# Patient Record
Sex: Female | Born: 1960 | Race: White | Hispanic: No | Marital: Single | Smoking: Former smoker
Health system: Southern US, Community
[De-identification: ages and names within clinical notes are randomized; demographics above are authoritative.]

---

## 1998-03-22 ENCOUNTER — Encounter (HOSPITAL_COMMUNITY): Admission: RE | Admit: 1998-03-22 | Discharge: 1998-06-20 | Payer: Self-pay

## 1998-03-27 ENCOUNTER — Ambulatory Visit (HOSPITAL_COMMUNITY): Admission: RE | Admit: 1998-03-27 | Discharge: 1998-03-27 | Payer: Self-pay

## 2001-06-25 ENCOUNTER — Ambulatory Visit (HOSPITAL_COMMUNITY): Admission: RE | Admit: 2001-06-25 | Discharge: 2001-06-25 | Payer: Self-pay | Admitting: Gastroenterology

## 2001-07-30 ENCOUNTER — Encounter: Payer: Self-pay | Admitting: Obstetrics and Gynecology

## 2001-07-30 ENCOUNTER — Encounter: Admission: RE | Admit: 2001-07-30 | Discharge: 2001-07-30 | Payer: Self-pay | Admitting: Obstetrics and Gynecology

## 2016-04-21 DIAGNOSIS — R42 Dizziness and giddiness: Secondary | ICD-10-CM | POA: Diagnosis not present

## 2016-05-27 DIAGNOSIS — H9193 Unspecified hearing loss, bilateral: Secondary | ICD-10-CM | POA: Diagnosis not present

## 2016-05-27 DIAGNOSIS — R42 Dizziness and giddiness: Secondary | ICD-10-CM | POA: Diagnosis not present

## 2016-05-29 DIAGNOSIS — R2689 Other abnormalities of gait and mobility: Secondary | ICD-10-CM | POA: Diagnosis not present

## 2016-05-29 DIAGNOSIS — H8113 Benign paroxysmal vertigo, bilateral: Secondary | ICD-10-CM | POA: Diagnosis not present

## 2016-07-18 DIAGNOSIS — L03221 Cellulitis of neck: Secondary | ICD-10-CM | POA: Diagnosis not present

## 2016-08-11 DIAGNOSIS — Z0000 Encounter for general adult medical examination without abnormal findings: Secondary | ICD-10-CM | POA: Diagnosis not present

## 2016-08-11 DIAGNOSIS — N63 Unspecified lump in breast: Secondary | ICD-10-CM | POA: Diagnosis not present

## 2016-08-12 DIAGNOSIS — Z0000 Encounter for general adult medical examination without abnormal findings: Secondary | ICD-10-CM | POA: Diagnosis not present

## 2016-08-26 DIAGNOSIS — R8290 Unspecified abnormal findings in urine: Secondary | ICD-10-CM | POA: Diagnosis not present

## 2016-09-04 DIAGNOSIS — Z803 Family history of malignant neoplasm of breast: Secondary | ICD-10-CM | POA: Diagnosis not present

## 2016-09-04 DIAGNOSIS — N63 Unspecified lump in breast: Secondary | ICD-10-CM | POA: Diagnosis not present

## 2016-10-16 DIAGNOSIS — G4733 Obstructive sleep apnea (adult) (pediatric): Secondary | ICD-10-CM | POA: Diagnosis not present

## 2016-11-16 DIAGNOSIS — G4733 Obstructive sleep apnea (adult) (pediatric): Secondary | ICD-10-CM | POA: Diagnosis not present

## 2016-12-16 DIAGNOSIS — G4733 Obstructive sleep apnea (adult) (pediatric): Secondary | ICD-10-CM | POA: Diagnosis not present

## 2016-12-17 DIAGNOSIS — G4733 Obstructive sleep apnea (adult) (pediatric): Secondary | ICD-10-CM | POA: Diagnosis not present

## 2017-01-16 DIAGNOSIS — G4733 Obstructive sleep apnea (adult) (pediatric): Secondary | ICD-10-CM | POA: Diagnosis not present

## 2017-02-16 DIAGNOSIS — G4733 Obstructive sleep apnea (adult) (pediatric): Secondary | ICD-10-CM | POA: Diagnosis not present

## 2017-02-23 DIAGNOSIS — E559 Vitamin D deficiency, unspecified: Secondary | ICD-10-CM | POA: Diagnosis not present

## 2017-02-23 DIAGNOSIS — Z79899 Other long term (current) drug therapy: Secondary | ICD-10-CM | POA: Diagnosis not present

## 2017-02-23 DIAGNOSIS — E785 Hyperlipidemia, unspecified: Secondary | ICD-10-CM | POA: Diagnosis not present

## 2017-02-26 DIAGNOSIS — G4733 Obstructive sleep apnea (adult) (pediatric): Secondary | ICD-10-CM | POA: Diagnosis not present

## 2017-03-16 DIAGNOSIS — G4733 Obstructive sleep apnea (adult) (pediatric): Secondary | ICD-10-CM | POA: Diagnosis not present

## 2017-03-19 DIAGNOSIS — N6322 Unspecified lump in the left breast, upper inner quadrant: Secondary | ICD-10-CM | POA: Diagnosis not present

## 2017-04-16 DIAGNOSIS — G4733 Obstructive sleep apnea (adult) (pediatric): Secondary | ICD-10-CM | POA: Diagnosis not present

## 2017-06-02 DIAGNOSIS — G4733 Obstructive sleep apnea (adult) (pediatric): Secondary | ICD-10-CM | POA: Diagnosis not present

## 2017-09-03 DIAGNOSIS — G4733 Obstructive sleep apnea (adult) (pediatric): Secondary | ICD-10-CM | POA: Diagnosis not present

## 2017-09-14 DIAGNOSIS — R109 Unspecified abdominal pain: Secondary | ICD-10-CM | POA: Diagnosis not present

## 2017-09-14 DIAGNOSIS — M545 Low back pain: Secondary | ICD-10-CM | POA: Diagnosis not present

## 2017-09-14 DIAGNOSIS — R110 Nausea: Secondary | ICD-10-CM | POA: Diagnosis not present

## 2017-10-01 DIAGNOSIS — F639 Impulse disorder, unspecified: Secondary | ICD-10-CM | POA: Diagnosis not present

## 2017-10-01 DIAGNOSIS — M549 Dorsalgia, unspecified: Secondary | ICD-10-CM | POA: Diagnosis not present

## 2017-10-01 DIAGNOSIS — M545 Low back pain: Secondary | ICD-10-CM | POA: Diagnosis not present

## 2017-10-01 DIAGNOSIS — E785 Hyperlipidemia, unspecified: Secondary | ICD-10-CM | POA: Diagnosis not present

## 2017-10-15 DIAGNOSIS — Z803 Family history of malignant neoplasm of breast: Secondary | ICD-10-CM | POA: Diagnosis not present

## 2017-10-15 DIAGNOSIS — Z1231 Encounter for screening mammogram for malignant neoplasm of breast: Secondary | ICD-10-CM | POA: Diagnosis not present

## 2017-10-23 DIAGNOSIS — M545 Low back pain: Secondary | ICD-10-CM | POA: Diagnosis not present

## 2017-12-10 DIAGNOSIS — G4733 Obstructive sleep apnea (adult) (pediatric): Secondary | ICD-10-CM | POA: Diagnosis not present

## 2018-03-16 DIAGNOSIS — G4733 Obstructive sleep apnea (adult) (pediatric): Secondary | ICD-10-CM | POA: Diagnosis not present

## 2018-06-14 DIAGNOSIS — G4733 Obstructive sleep apnea (adult) (pediatric): Secondary | ICD-10-CM | POA: Diagnosis not present

## 2018-07-08 DIAGNOSIS — M25552 Pain in left hip: Secondary | ICD-10-CM | POA: Diagnosis not present

## 2018-07-08 DIAGNOSIS — F639 Impulse disorder, unspecified: Secondary | ICD-10-CM | POA: Diagnosis not present

## 2018-07-08 DIAGNOSIS — E785 Hyperlipidemia, unspecified: Secondary | ICD-10-CM | POA: Diagnosis not present

## 2018-07-14 DIAGNOSIS — G4733 Obstructive sleep apnea (adult) (pediatric): Secondary | ICD-10-CM | POA: Diagnosis not present

## 2018-08-13 DIAGNOSIS — G4733 Obstructive sleep apnea (adult) (pediatric): Secondary | ICD-10-CM | POA: Diagnosis not present

## 2018-09-14 DIAGNOSIS — G4733 Obstructive sleep apnea (adult) (pediatric): Secondary | ICD-10-CM | POA: Diagnosis not present

## 2018-09-15 DIAGNOSIS — G4733 Obstructive sleep apnea (adult) (pediatric): Secondary | ICD-10-CM | POA: Diagnosis not present

## 2018-10-14 DIAGNOSIS — Z23 Encounter for immunization: Secondary | ICD-10-CM | POA: Diagnosis not present

## 2018-11-02 DIAGNOSIS — J0110 Acute frontal sinusitis, unspecified: Secondary | ICD-10-CM | POA: Diagnosis not present

## 2018-11-02 DIAGNOSIS — Z6831 Body mass index (BMI) 31.0-31.9, adult: Secondary | ICD-10-CM | POA: Diagnosis not present

## 2018-11-02 DIAGNOSIS — R05 Cough: Secondary | ICD-10-CM | POA: Diagnosis not present

## 2018-11-25 DIAGNOSIS — Z803 Family history of malignant neoplasm of breast: Secondary | ICD-10-CM | POA: Diagnosis not present

## 2018-11-25 DIAGNOSIS — Z1231 Encounter for screening mammogram for malignant neoplasm of breast: Secondary | ICD-10-CM | POA: Diagnosis not present

## 2018-12-16 DIAGNOSIS — G4733 Obstructive sleep apnea (adult) (pediatric): Secondary | ICD-10-CM | POA: Diagnosis not present

## 2019-01-11 DIAGNOSIS — Z79899 Other long term (current) drug therapy: Secondary | ICD-10-CM | POA: Diagnosis not present

## 2019-01-11 DIAGNOSIS — F639 Impulse disorder, unspecified: Secondary | ICD-10-CM | POA: Diagnosis not present

## 2019-01-11 DIAGNOSIS — Z6830 Body mass index (BMI) 30.0-30.9, adult: Secondary | ICD-10-CM | POA: Diagnosis not present

## 2019-01-11 DIAGNOSIS — E785 Hyperlipidemia, unspecified: Secondary | ICD-10-CM | POA: Diagnosis not present

## 2019-04-04 DIAGNOSIS — G4733 Obstructive sleep apnea (adult) (pediatric): Secondary | ICD-10-CM | POA: Diagnosis not present

## 2019-04-25 DIAGNOSIS — Z6831 Body mass index (BMI) 31.0-31.9, adult: Secondary | ICD-10-CM | POA: Diagnosis not present

## 2019-04-25 DIAGNOSIS — R5383 Other fatigue: Secondary | ICD-10-CM | POA: Diagnosis not present

## 2019-04-25 DIAGNOSIS — R110 Nausea: Secondary | ICD-10-CM | POA: Diagnosis not present

## 2019-04-25 DIAGNOSIS — R109 Unspecified abdominal pain: Secondary | ICD-10-CM | POA: Diagnosis not present

## 2019-04-26 DIAGNOSIS — E785 Hyperlipidemia, unspecified: Secondary | ICD-10-CM | POA: Diagnosis not present

## 2019-04-26 DIAGNOSIS — R450 Nervousness: Secondary | ICD-10-CM | POA: Diagnosis not present

## 2019-04-26 DIAGNOSIS — R109 Unspecified abdominal pain: Secondary | ICD-10-CM | POA: Diagnosis not present

## 2019-04-26 DIAGNOSIS — W57XXXD Bitten or stung by nonvenomous insect and other nonvenomous arthropods, subsequent encounter: Secondary | ICD-10-CM | POA: Diagnosis not present

## 2019-04-26 DIAGNOSIS — R739 Hyperglycemia, unspecified: Secondary | ICD-10-CM | POA: Diagnosis not present

## 2019-04-26 DIAGNOSIS — R5383 Other fatigue: Secondary | ICD-10-CM | POA: Diagnosis not present

## 2019-05-10 DIAGNOSIS — R7303 Prediabetes: Secondary | ICD-10-CM | POA: Diagnosis not present

## 2019-05-10 DIAGNOSIS — R5383 Other fatigue: Secondary | ICD-10-CM | POA: Diagnosis not present

## 2019-05-10 DIAGNOSIS — Z6832 Body mass index (BMI) 32.0-32.9, adult: Secondary | ICD-10-CM | POA: Diagnosis not present

## 2019-05-10 DIAGNOSIS — R1084 Generalized abdominal pain: Secondary | ICD-10-CM | POA: Diagnosis not present

## 2019-05-18 DIAGNOSIS — K7689 Other specified diseases of liver: Secondary | ICD-10-CM | POA: Diagnosis not present

## 2019-05-18 DIAGNOSIS — R1084 Generalized abdominal pain: Secondary | ICD-10-CM | POA: Diagnosis not present

## 2019-05-27 DIAGNOSIS — R1011 Right upper quadrant pain: Secondary | ICD-10-CM | POA: Diagnosis not present

## 2019-06-01 DIAGNOSIS — K769 Liver disease, unspecified: Secondary | ICD-10-CM | POA: Diagnosis not present

## 2019-06-01 DIAGNOSIS — D1809 Hemangioma of other sites: Secondary | ICD-10-CM | POA: Diagnosis not present

## 2019-06-01 DIAGNOSIS — D1803 Hemangioma of intra-abdominal structures: Secondary | ICD-10-CM | POA: Diagnosis not present

## 2019-06-03 DIAGNOSIS — D1803 Hemangioma of intra-abdominal structures: Secondary | ICD-10-CM | POA: Diagnosis not present

## 2019-06-13 DIAGNOSIS — R1032 Left lower quadrant pain: Secondary | ICD-10-CM | POA: Diagnosis not present

## 2019-06-14 DIAGNOSIS — M479 Spondylosis, unspecified: Secondary | ICD-10-CM | POA: Diagnosis not present

## 2019-06-14 DIAGNOSIS — Z6832 Body mass index (BMI) 32.0-32.9, adult: Secondary | ICD-10-CM | POA: Diagnosis not present

## 2019-06-14 DIAGNOSIS — R932 Abnormal findings on diagnostic imaging of liver and biliary tract: Secondary | ICD-10-CM | POA: Diagnosis not present

## 2019-06-14 DIAGNOSIS — R1084 Generalized abdominal pain: Secondary | ICD-10-CM | POA: Diagnosis not present

## 2019-06-23 DIAGNOSIS — R109 Unspecified abdominal pain: Secondary | ICD-10-CM | POA: Diagnosis not present

## 2019-06-23 DIAGNOSIS — D126 Benign neoplasm of colon, unspecified: Secondary | ICD-10-CM | POA: Diagnosis not present

## 2019-06-23 DIAGNOSIS — Z90710 Acquired absence of both cervix and uterus: Secondary | ICD-10-CM | POA: Diagnosis not present

## 2019-06-23 DIAGNOSIS — K648 Other hemorrhoids: Secondary | ICD-10-CM | POA: Diagnosis not present

## 2019-06-23 DIAGNOSIS — D124 Benign neoplasm of descending colon: Secondary | ICD-10-CM | POA: Diagnosis not present

## 2019-06-23 DIAGNOSIS — R079 Chest pain, unspecified: Secondary | ICD-10-CM | POA: Diagnosis not present

## 2019-06-23 DIAGNOSIS — R1013 Epigastric pain: Secondary | ICD-10-CM | POA: Diagnosis not present

## 2019-06-23 DIAGNOSIS — K635 Polyp of colon: Secondary | ICD-10-CM | POA: Diagnosis not present

## 2019-06-23 DIAGNOSIS — K229 Disease of esophagus, unspecified: Secondary | ICD-10-CM | POA: Diagnosis not present

## 2019-06-23 DIAGNOSIS — Z79899 Other long term (current) drug therapy: Secondary | ICD-10-CM | POA: Diagnosis not present

## 2019-06-23 DIAGNOSIS — K219 Gastro-esophageal reflux disease without esophagitis: Secondary | ICD-10-CM | POA: Diagnosis not present

## 2019-06-29 DIAGNOSIS — R079 Chest pain, unspecified: Secondary | ICD-10-CM | POA: Diagnosis not present

## 2019-06-29 DIAGNOSIS — M545 Low back pain: Secondary | ICD-10-CM | POA: Diagnosis not present

## 2019-06-29 DIAGNOSIS — M47816 Spondylosis without myelopathy or radiculopathy, lumbar region: Secondary | ICD-10-CM | POA: Diagnosis not present

## 2019-06-29 DIAGNOSIS — M5136 Other intervertebral disc degeneration, lumbar region: Secondary | ICD-10-CM | POA: Diagnosis not present

## 2019-06-29 DIAGNOSIS — R1084 Generalized abdominal pain: Secondary | ICD-10-CM | POA: Diagnosis not present

## 2019-06-29 DIAGNOSIS — R0602 Shortness of breath: Secondary | ICD-10-CM | POA: Diagnosis not present

## 2019-07-06 DIAGNOSIS — M545 Low back pain: Secondary | ICD-10-CM | POA: Diagnosis not present

## 2019-07-07 DIAGNOSIS — Z6832 Body mass index (BMI) 32.0-32.9, adult: Secondary | ICD-10-CM | POA: Diagnosis not present

## 2019-07-07 DIAGNOSIS — M47816 Spondylosis without myelopathy or radiculopathy, lumbar region: Secondary | ICD-10-CM | POA: Diagnosis not present

## 2019-07-08 DIAGNOSIS — I10 Essential (primary) hypertension: Secondary | ICD-10-CM | POA: Diagnosis not present

## 2019-07-08 DIAGNOSIS — M545 Low back pain: Secondary | ICD-10-CM | POA: Diagnosis not present

## 2019-07-12 DIAGNOSIS — I10 Essential (primary) hypertension: Secondary | ICD-10-CM | POA: Diagnosis not present

## 2019-07-12 DIAGNOSIS — M545 Low back pain: Secondary | ICD-10-CM | POA: Diagnosis not present

## 2019-07-12 DIAGNOSIS — M47816 Spondylosis without myelopathy or radiculopathy, lumbar region: Secondary | ICD-10-CM | POA: Diagnosis not present

## 2019-07-12 DIAGNOSIS — M5136 Other intervertebral disc degeneration, lumbar region: Secondary | ICD-10-CM | POA: Diagnosis not present

## 2019-07-12 DIAGNOSIS — M48061 Spinal stenosis, lumbar region without neurogenic claudication: Secondary | ICD-10-CM | POA: Diagnosis not present

## 2019-07-15 DIAGNOSIS — M545 Low back pain: Secondary | ICD-10-CM | POA: Diagnosis not present

## 2019-07-18 DIAGNOSIS — M545 Low back pain: Secondary | ICD-10-CM | POA: Diagnosis not present

## 2019-07-20 DIAGNOSIS — M545 Low back pain: Secondary | ICD-10-CM | POA: Diagnosis not present

## 2019-07-25 DIAGNOSIS — M545 Low back pain: Secondary | ICD-10-CM | POA: Diagnosis not present

## 2019-07-27 DIAGNOSIS — R1013 Epigastric pain: Secondary | ICD-10-CM | POA: Diagnosis not present

## 2019-08-02 DIAGNOSIS — M545 Low back pain: Secondary | ICD-10-CM | POA: Diagnosis not present

## 2019-11-29 DIAGNOSIS — D225 Melanocytic nevi of trunk: Secondary | ICD-10-CM | POA: Diagnosis not present

## 2019-11-29 DIAGNOSIS — D1801 Hemangioma of skin and subcutaneous tissue: Secondary | ICD-10-CM | POA: Diagnosis not present

## 2019-11-29 DIAGNOSIS — L918 Other hypertrophic disorders of the skin: Secondary | ICD-10-CM | POA: Diagnosis not present

## 2019-11-29 DIAGNOSIS — L820 Inflamed seborrheic keratosis: Secondary | ICD-10-CM | POA: Diagnosis not present

## 2019-11-29 DIAGNOSIS — B351 Tinea unguium: Secondary | ICD-10-CM | POA: Diagnosis not present

## 2020-07-19 DIAGNOSIS — E785 Hyperlipidemia, unspecified: Secondary | ICD-10-CM | POA: Diagnosis not present

## 2020-07-19 DIAGNOSIS — F639 Impulse disorder, unspecified: Secondary | ICD-10-CM | POA: Diagnosis not present

## 2020-07-19 DIAGNOSIS — Z131 Encounter for screening for diabetes mellitus: Secondary | ICD-10-CM | POA: Diagnosis not present

## 2020-07-19 DIAGNOSIS — M47816 Spondylosis without myelopathy or radiculopathy, lumbar region: Secondary | ICD-10-CM | POA: Diagnosis not present

## 2020-07-23 DIAGNOSIS — E785 Hyperlipidemia, unspecified: Secondary | ICD-10-CM | POA: Diagnosis not present

## 2020-07-23 DIAGNOSIS — Z79899 Other long term (current) drug therapy: Secondary | ICD-10-CM | POA: Diagnosis not present

## 2020-07-23 DIAGNOSIS — Z131 Encounter for screening for diabetes mellitus: Secondary | ICD-10-CM | POA: Diagnosis not present

## 2020-11-19 DIAGNOSIS — Z1231 Encounter for screening mammogram for malignant neoplasm of breast: Secondary | ICD-10-CM | POA: Diagnosis not present

## 2020-12-31 DIAGNOSIS — Z20828 Contact with and (suspected) exposure to other viral communicable diseases: Secondary | ICD-10-CM | POA: Diagnosis not present

## 2021-01-11 DIAGNOSIS — R519 Headache, unspecified: Secondary | ICD-10-CM | POA: Diagnosis not present

## 2021-01-11 DIAGNOSIS — J029 Acute pharyngitis, unspecified: Secondary | ICD-10-CM | POA: Diagnosis not present

## 2021-01-11 DIAGNOSIS — R059 Cough, unspecified: Secondary | ICD-10-CM | POA: Diagnosis not present

## 2021-01-11 DIAGNOSIS — R051 Acute cough: Secondary | ICD-10-CM | POA: Diagnosis not present

## 2021-01-14 DIAGNOSIS — R0602 Shortness of breath: Secondary | ICD-10-CM | POA: Diagnosis not present

## 2021-01-14 DIAGNOSIS — J189 Pneumonia, unspecified organism: Secondary | ICD-10-CM | POA: Diagnosis not present

## 2021-01-14 DIAGNOSIS — U071 COVID-19: Secondary | ICD-10-CM | POA: Diagnosis not present

## 2021-04-12 DIAGNOSIS — Z79899 Other long term (current) drug therapy: Secondary | ICD-10-CM | POA: Diagnosis not present

## 2021-04-12 DIAGNOSIS — R7301 Impaired fasting glucose: Secondary | ICD-10-CM | POA: Diagnosis not present

## 2021-04-12 DIAGNOSIS — G4733 Obstructive sleep apnea (adult) (pediatric): Secondary | ICD-10-CM | POA: Diagnosis not present

## 2021-04-12 DIAGNOSIS — E785 Hyperlipidemia, unspecified: Secondary | ICD-10-CM | POA: Diagnosis not present

## 2021-04-12 DIAGNOSIS — K219 Gastro-esophageal reflux disease without esophagitis: Secondary | ICD-10-CM | POA: Diagnosis not present

## 2021-04-26 DIAGNOSIS — J209 Acute bronchitis, unspecified: Secondary | ICD-10-CM | POA: Diagnosis not present

## 2021-05-26 DIAGNOSIS — S93402A Sprain of unspecified ligament of left ankle, initial encounter: Secondary | ICD-10-CM | POA: Diagnosis not present

## 2021-06-20 DIAGNOSIS — R4701 Aphasia: Secondary | ICD-10-CM | POA: Diagnosis not present

## 2021-06-20 DIAGNOSIS — R4182 Altered mental status, unspecified: Secondary | ICD-10-CM | POA: Diagnosis not present

## 2021-06-20 DIAGNOSIS — I517 Cardiomegaly: Secondary | ICD-10-CM | POA: Diagnosis not present

## 2021-06-20 DIAGNOSIS — I6529 Occlusion and stenosis of unspecified carotid artery: Secondary | ICD-10-CM | POA: Diagnosis not present

## 2021-07-02 DIAGNOSIS — L989 Disorder of the skin and subcutaneous tissue, unspecified: Secondary | ICD-10-CM | POA: Diagnosis not present

## 2021-07-02 DIAGNOSIS — M25569 Pain in unspecified knee: Secondary | ICD-10-CM | POA: Diagnosis not present

## 2021-07-02 DIAGNOSIS — M791 Myalgia, unspecified site: Secondary | ICD-10-CM | POA: Diagnosis not present

## 2021-07-02 DIAGNOSIS — M2550 Pain in unspecified joint: Secondary | ICD-10-CM | POA: Diagnosis not present

## 2021-07-08 DIAGNOSIS — C4401 Basal cell carcinoma of skin of lip: Secondary | ICD-10-CM | POA: Diagnosis not present

## 2021-07-08 DIAGNOSIS — L821 Other seborrheic keratosis: Secondary | ICD-10-CM | POA: Diagnosis not present

## 2021-07-08 DIAGNOSIS — L570 Actinic keratosis: Secondary | ICD-10-CM | POA: Diagnosis not present

## 2021-07-08 DIAGNOSIS — D1801 Hemangioma of skin and subcutaneous tissue: Secondary | ICD-10-CM | POA: Diagnosis not present

## 2021-07-08 DIAGNOSIS — L578 Other skin changes due to chronic exposure to nonionizing radiation: Secondary | ICD-10-CM | POA: Diagnosis not present

## 2021-07-23 DIAGNOSIS — M25569 Pain in unspecified knee: Secondary | ICD-10-CM | POA: Diagnosis not present

## 2021-07-23 DIAGNOSIS — M25562 Pain in left knee: Secondary | ICD-10-CM | POA: Diagnosis not present

## 2021-07-23 DIAGNOSIS — M25561 Pain in right knee: Secondary | ICD-10-CM | POA: Diagnosis not present

## 2021-07-30 DIAGNOSIS — R413 Other amnesia: Secondary | ICD-10-CM | POA: Diagnosis not present

## 2021-07-30 DIAGNOSIS — G459 Transient cerebral ischemic attack, unspecified: Secondary | ICD-10-CM | POA: Diagnosis not present

## 2021-07-30 DIAGNOSIS — I6523 Occlusion and stenosis of bilateral carotid arteries: Secondary | ICD-10-CM | POA: Diagnosis not present

## 2021-08-13 DIAGNOSIS — G8929 Other chronic pain: Secondary | ICD-10-CM | POA: Diagnosis not present

## 2021-08-13 DIAGNOSIS — M25562 Pain in left knee: Secondary | ICD-10-CM | POA: Diagnosis not present

## 2021-08-13 DIAGNOSIS — M1712 Unilateral primary osteoarthritis, left knee: Secondary | ICD-10-CM | POA: Diagnosis not present

## 2021-08-14 DIAGNOSIS — E785 Hyperlipidemia, unspecified: Secondary | ICD-10-CM | POA: Diagnosis not present

## 2021-08-14 DIAGNOSIS — F639 Impulse disorder, unspecified: Secondary | ICD-10-CM | POA: Diagnosis not present

## 2021-08-14 DIAGNOSIS — G4733 Obstructive sleep apnea (adult) (pediatric): Secondary | ICD-10-CM | POA: Diagnosis not present

## 2021-08-14 DIAGNOSIS — R0602 Shortness of breath: Secondary | ICD-10-CM | POA: Diagnosis not present

## 2021-08-15 DIAGNOSIS — R0602 Shortness of breath: Secondary | ICD-10-CM | POA: Diagnosis not present

## 2021-09-09 DIAGNOSIS — C44319 Basal cell carcinoma of skin of other parts of face: Secondary | ICD-10-CM | POA: Diagnosis not present

## 2021-09-09 DIAGNOSIS — L570 Actinic keratosis: Secondary | ICD-10-CM | POA: Diagnosis not present

## 2021-09-09 DIAGNOSIS — L720 Epidermal cyst: Secondary | ICD-10-CM | POA: Diagnosis not present

## 2021-09-10 DIAGNOSIS — M1712 Unilateral primary osteoarthritis, left knee: Secondary | ICD-10-CM | POA: Diagnosis not present

## 2021-09-10 DIAGNOSIS — G8929 Other chronic pain: Secondary | ICD-10-CM | POA: Diagnosis not present

## 2021-09-10 DIAGNOSIS — M25562 Pain in left knee: Secondary | ICD-10-CM | POA: Diagnosis not present

## 2021-10-01 ENCOUNTER — Ambulatory Visit: Payer: BC Managed Care – PPO | Admitting: Pulmonary Disease

## 2021-10-01 ENCOUNTER — Other Ambulatory Visit: Payer: Self-pay

## 2021-10-01 ENCOUNTER — Encounter: Payer: Self-pay | Admitting: Pulmonary Disease

## 2021-10-01 VITALS — BP 122/62 | HR 82 | Temp 98.1°F | Ht 62.0 in | Wt 181.0 lb

## 2021-10-01 DIAGNOSIS — R0602 Shortness of breath: Secondary | ICD-10-CM

## 2021-10-01 NOTE — Patient Instructions (Signed)
Please get CT chest without contrast and PFTs for further evaluation of shortness of breath Return to clinic after these tests for reevaluation

## 2021-10-01 NOTE — Progress Notes (Signed)
Yvette Ortiz    [id number]    [birth year]  Primary Care Physician:Prochnau, Rayfield Citizen, MD  Referring Physician: Philemon Kingdom, MD [address] [place],  [place] [address]  Chief complaint: Consult for dyspnea  HPI: Ex-smoker with history of hyperlipidemia, GERD, OSA on CPAP Complains of dyspnea for the past 1 to 2 years.  Symptoms are at exertion and bending over.  Denies any cough, sputum production or fevers or chills  She has occasional swelling of the feet and prior imaging that shows cardiomegaly As per the patient she had a cardiac evaluation including echocardiogram at [facility] and was told it was normal She has history of OSA and is compliant with CPAP, primary care   Pets: 3 cats Occupation: Works in a Stage manager Exposures: No mold, hot tub, Jacuzzi.  No feather pillows or comforter Smoking history: Smoked 1 pack/week from 1980- 2007 Travel history: No significant travel history Relevant family history: Mother and father died of lung cancer.  They were smokers.  Outpatient Encounter Medications as of 10/01/2021  Medication Sig   albuterol (VENTOLIN HFA) 108 (90 Base) MCG/ACT inhaler SMARTSIG:2 Puff(s) By Mouth 4 Times Daily   ALPRAZolam (XANAX) 0.5 MG tablet Take 0.5 mg by mouth 3 (three) times daily as needed.   dicyclomine (BENTYL) 10 MG capsule Take 10 mg by mouth every 6 (six) hours as needed.   FLUoxetine (PROZAC) 40 MG capsule Take 40 mg by mouth daily.   lamoTRIgine (LAMICTAL) 150 MG tablet Take 150 mg by mouth 2 (two) times daily.   naproxen (NAPROSYN) 500 MG tablet Take 500 mg by mouth 2 (two) times daily.   omeprazole (PRILOSEC) 40 MG capsule Take 40 mg by mouth every morning.   tiZANidine (ZANAFLEX) 4 MG tablet Take 4 mg by mouth every 8 (eight) hours as needed.   atorvastatin (LIPITOR) 40 MG tablet Take 40 mg by mouth daily. (Patient not taking: Reported on 10/01/2021)   No facility-administered encounter  medications on file as of 10/01/2021.    Allergies as of 10/01/2021 - Review Complete 10/01/2021  Allergen Reaction Noted   Meloxicam Rash 10/01/2021    No past medical history on file.  History reviewed. No pertinent surgical history.  No family history on file.  Social History   Socioeconomic History   Marital status: Single    Spouse name: Not on file   Number of children: Not on file   Years of education: Not on file   Highest education level: Not on file  Occupational History   Not on file  Tobacco Use   Smoking status: Former    Packs/day: 0.50    Types: Cigarettes    Start date: 12/22/1980    Quit date: 12/22/2005    Years since quitting: 15.7   Smokeless tobacco: Never   Tobacco comments:    1 pack lasted a week  Substance and Sexual Activity   Alcohol use: Not on file   Drug use: Not on file   Sexual activity: Not on file  Other Topics Concern   Not on file  Social History Narrative   Not on file   Social Determinants of Health   Financial Resource Strain: Not on file  Food Insecurity: Not on file  Transportation Needs: Not on file  Physical Activity: Not on file  Stress: Not on file  Social Connections: Not on file  Intimate Partner Violence: Not on file    Review of  systems: Review of Systems  Constitutional: Negative for fever and chills.  HENT: Negative.   Eyes: Negative for blurred vision.  Respiratory: as per HPI  Cardiovascular: Negative for chest pain and palpitations.  Gastrointestinal: Negative for vomiting, diarrhea, blood per rectum. Genitourinary: Negative for dysuria, urgency, frequency and hematuria.  Musculoskeletal: Negative for myalgias, back pain and joint pain.  Skin: Negative for itching and rash.  Neurological: Negative for dizziness, tremors, focal weakness, seizures and loss of consciousness.  Endo/Heme/Allergies: Negative for environmental allergies.  Psychiatric/Behavioral: Negative for depression, suicidal ideas and  hallucinations.  All other systems reviewed and are negative.  Physical Exam: Blood pressure 122/62, pulse 82, temperature 98.1 F (36.7 C), temperature source Oral, height 5\' 2"  (1.575 m), weight 181 lb (82.1 kg), SpO2 95 %. Gen:      No acute distress HEENT:  EOMI, sclera anicteric Neck:     No masses; no thyromegaly Lungs:    Clear to auscultation bilaterally; normal respiratory effort CV:         Regular rate and rhythm; no murmurs Abd:      + bowel sounds; soft, non-tender; no palpable masses, no distension Ext:    No edema; adequate peripheral perfusion Skin:      Warm and dry; no rash Neuro: alert and oriented x 3 Psych: normal mood and affect  Data Reviewed: Imaging: CT abdomen pelvis 04/18/2019-visualized lung bases appear clear Chest x-ray [facility] 06/20/2021-lungs are clear, cardiomegaly I have reviewed the images personally.   PFTs:  Labs:  Assessment:  Consult for dyspnea Etiology for dyspnea is unclear. I have reviewed her imaging from [place] which showed clear lungs.  There is notation of cardiomegaly on chest x-ray but as per the patient cardiac work-up has been negative.  She has minimal smoking history and symptoms are not consistent with COPD. Obtain records from primary care CT chest without contrast and PFTs  OSA On CPAP from primary care  Plan/Recommendations: Obtain records CT chest PFTs  Bethesda MD [facility] 10/01/2021, 3:45 PM  CC: 12/01/2021, MD

## 2021-10-04 ENCOUNTER — Telehealth: Payer: Self-pay | Admitting: Pulmonary Disease

## 2021-10-04 NOTE — Telephone Encounter (Signed)
disregard

## 2021-10-07 ENCOUNTER — Telehealth: Payer: Self-pay | Admitting: Pulmonary Disease

## 2021-10-08 DIAGNOSIS — M25562 Pain in left knee: Secondary | ICD-10-CM | POA: Diagnosis not present

## 2021-10-08 NOTE — Telephone Encounter (Signed)
[employer] Mobile number VM is full called home number spoke to her roommate and she took the number to call can resc patient's ct

## 2021-10-14 DIAGNOSIS — M1712 Unilateral primary osteoarthritis, left knee: Secondary | ICD-10-CM | POA: Diagnosis not present

## 2021-10-15 ENCOUNTER — Other Ambulatory Visit: Payer: BC Managed Care – PPO

## 2021-10-22 ENCOUNTER — Ambulatory Visit (INDEPENDENT_AMBULATORY_CARE_PROVIDER_SITE_OTHER)
Admission: RE | Admit: 2021-10-22 | Discharge: 2021-10-22 | Disposition: A | Discharge status: Home / Self Care | Payer: BC Managed Care – PPO | Source: Ambulatory Visit | Attending: Pulmonary Disease | Admitting: Pulmonary Disease

## 2021-10-22 ENCOUNTER — Other Ambulatory Visit: Payer: Self-pay

## 2021-10-22 DIAGNOSIS — R0602 Shortness of breath: Secondary | ICD-10-CM

## 2021-10-22 DIAGNOSIS — R911 Solitary pulmonary nodule: Secondary | ICD-10-CM | POA: Diagnosis not present

## 2021-10-22 DIAGNOSIS — R918 Other nonspecific abnormal finding of lung field: Secondary | ICD-10-CM | POA: Diagnosis not present

## 2021-10-22 DIAGNOSIS — I700 Atherosclerosis of aorta: Secondary | ICD-10-CM | POA: Diagnosis not present

## 2021-10-31 ENCOUNTER — Telehealth: Payer: Self-pay | Admitting: Pulmonary Disease

## 2021-10-31 DIAGNOSIS — R918 Other nonspecific abnormal finding of lung field: Secondary | ICD-10-CM

## 2021-10-31 NOTE — Telephone Encounter (Signed)
Called and spoke with Patient.  Dr. Mannam's results and recommendations given. Understanding stated. CT chest  order placed.  Nothing further at this time.  

## 2021-11-25 DIAGNOSIS — Z1231 Encounter for screening mammogram for malignant neoplasm of breast: Secondary | ICD-10-CM | POA: Diagnosis not present

## 2021-12-02 ENCOUNTER — Other Ambulatory Visit: Payer: Self-pay | Admitting: Pulmonary Disease

## 2021-12-03 DIAGNOSIS — M25562 Pain in left knee: Secondary | ICD-10-CM | POA: Diagnosis not present

## 2021-12-03 DIAGNOSIS — S83242A Other tear of medial meniscus, current injury, left knee, initial encounter: Secondary | ICD-10-CM | POA: Diagnosis not present

## 2021-12-03 LAB — SARS CORONAVIRUS 2 (TAT 6-24 HRS): SARS Coronavirus 2: NEGATIVE

## 2021-12-05 ENCOUNTER — Ambulatory Visit: Payer: BC Managed Care – PPO | Admitting: Pulmonary Disease

## 2021-12-05 ENCOUNTER — Other Ambulatory Visit: Payer: Self-pay

## 2021-12-05 DIAGNOSIS — R0602 Shortness of breath: Secondary | ICD-10-CM

## 2021-12-05 LAB — PULMONARY FUNCTION TEST
DL/VA % pred: 98 %
DL/VA: 4.2 ml/min/mmHg/L
DLCO cor % pred: 76 %
DLCO cor: 14.5 ml/min/mmHg
DLCO unc % pred: 76 %
DLCO unc: 14.5 ml/min/mmHg
FEF 25-75 Post: 2.21 L/sec
FEF 25-75 Pre: 1.93 L/sec
FEF2575-%Change-Post: 14 %
FEF2575-%Pred-Post: 101 %
FEF2575-%Pred-Pre: 88 %
FEV1-%Change-Post: 11 %
FEV1-%Pred-Post: 76 %
FEV1-%Pred-Pre: 69 %
FEV1-Post: 1.82 L
FEV1-Pre: 1.63 L
FEV1FVC-%Change-Post: 1 %
FEV1FVC-%Pred-Pre: 107 %
FEV6-%Change-Post: 9 %
FEV6-%Pred-Post: 71 %
FEV6-%Pred-Pre: 65 %
FEV6-Post: 2.12 L
FEV6-Pre: 1.94 L
FEV6FVC-%Change-Post: 0 %
FEV6FVC-%Pred-Post: 103 %
FEV6FVC-%Pred-Pre: 103 %
FVC-%Change-Post: 9 %
FVC-%Pred-Post: 69 %
FVC-%Pred-Pre: 63 %
FVC-Post: 2.12 L
FVC-Pre: 1.95 L
Post FEV1/FVC ratio: 85 %
Post FEV6/FVC ratio: 100 %
Pre FEV1/FVC ratio: 84 %
Pre FEV6/FVC Ratio: 100 %
RV % pred: 78 %
RV: 1.53 L
TLC % pred: 78 %
TLC: 3.79 L

## 2021-12-05 NOTE — Progress Notes (Signed)
PFT done today. 

## 2021-12-06 ENCOUNTER — Ambulatory Visit: Payer: BC Managed Care – PPO | Admitting: Pulmonary Disease

## 2021-12-06 ENCOUNTER — Encounter: Payer: Self-pay | Admitting: Pulmonary Disease

## 2021-12-06 VITALS — BP 142/80 | HR 96 | Temp 98.4°F | Ht 62.5 in | Wt 185.0 lb

## 2021-12-06 DIAGNOSIS — J4540 Moderate persistent asthma, uncomplicated: Secondary | ICD-10-CM

## 2021-12-06 DIAGNOSIS — R918 Other nonspecific abnormal finding of lung field: Secondary | ICD-10-CM | POA: Diagnosis not present

## 2021-12-06 DIAGNOSIS — R0602 Shortness of breath: Secondary | ICD-10-CM | POA: Diagnosis not present

## 2021-12-06 LAB — CBC WITH DIFFERENTIAL/PLATELET
Basophils Absolute: 0 10*3/uL (ref 0.0–0.1)
Basophils Relative: 0.6 % (ref 0.0–3.0)
Eosinophils Absolute: 0.3 10*3/uL (ref 0.0–0.7)
Eosinophils Relative: 4.1 % (ref 0.0–5.0)
HCT: 38.4 % (ref 36.0–46.0)
Hemoglobin: 12.7 g/dL (ref 12.0–15.0)
Lymphocytes Relative: 35.3 % (ref 12.0–46.0)
Lymphs Abs: 2.8 10*3/uL (ref 0.7–4.0)
MCHC: 33.1 g/dL (ref 30.0–36.0)
MCV: 89.3 fl (ref 78.0–100.0)
Monocytes Absolute: 0.6 10*3/uL (ref 0.1–1.0)
Monocytes Relative: 7.8 % (ref 3.0–12.0)
Neutro Abs: 4.1 10*3/uL (ref 1.4–7.7)
Neutrophils Relative %: 52.2 % (ref 43.0–77.0)
Platelets: 237 10*3/uL (ref 150.0–400.0)
RBC: 4.3 Mil/uL (ref 3.87–5.11)
RDW: 13.4 % (ref 11.5–15.5)
WBC: 7.8 10*3/uL (ref 4.0–10.5)

## 2021-12-06 MED ORDER — DULERA 200-5 MCG/ACT IN AERO: 2.0000 | INHALATION_SPRAY | Freq: Two times a day (BID) | RESPIRATORY_TRACT | 5 refills | Status: AC

## 2021-12-06 NOTE — Progress Notes (Addendum)
Yvette Ortiz    [id number]    1961-04-13  Primary Care Physician:Prochnau, Rayfield Citizen, MD  Referring Physician: Philemon Kingdom, MD [address] [place],  [place] [address]  Chief complaint: Consult for dyspnea  HPI: Ex-smoker with history of hyperlipidemia, GERD, OSA on CPAP Complains of dyspnea for the past 1 to 2 years.  Symptoms are at exertion and bending over.  Denies any cough, sputum production or fevers or chills  She has occasional swelling of the feet and prior imaging that shows cardiomegaly As per the patient she had a cardiac evaluation including echocardiogram at [facility] and was told it was normal She has history of OSA and is compliant with CPAP, primary care  Pets: 3 cats Occupation: Works in a Stage manager Exposures: No mold, hot tub, Jacuzzi.  No feather pillows or comforter Smoking history: Smoked 1 pack/week from 1980- 2007 Travel history: No significant travel history Relevant family history: Mother and father died of lung cancer.  They were smokers.  Interim history: She is here for review of PFTs and CT scan States that dyspnea is still unchanged.  No new complaints.  Outpatient Encounter Medications as of 12/06/2021  Medication Sig   albuterol (VENTOLIN HFA) 108 (90 Base) MCG/ACT inhaler SMARTSIG:2 Puff(s) By Mouth 4 Times Daily   ALPRAZolam (XANAX) 0.5 MG tablet Take 0.5 mg by mouth 3 (three) times daily as needed.   diclofenac (VOLTAREN) 75 MG EC tablet Take 75 mg by mouth 2 (two) times daily.   dicyclomine (BENTYL) 10 MG capsule Take 10 mg by mouth every 6 (six) hours as needed.   FLUoxetine (PROZAC) 40 MG capsule Take 40 mg by mouth daily.   lamoTRIgine (LAMICTAL) 150 MG tablet Take 150 mg by mouth 2 (two) times daily.   omeprazole (PRILOSEC) 40 MG capsule Take 40 mg by mouth every morning.   tiZANidine (ZANAFLEX) 4 MG tablet Take 4 mg by mouth every 8 (eight) hours as needed.   atorvastatin (LIPITOR) 40 MG  tablet Take 40 mg by mouth daily. (Patient not taking: Reported on 10/01/2021)   naproxen (NAPROSYN) 500 MG tablet Take 500 mg by mouth 2 (two) times daily. (Patient not taking: Reported on 12/06/2021)   No facility-administered encounter medications on file as of 12/06/2021.   Physical Exam: Blood pressure (!) 142/80, pulse 96, temperature 98.4 F (36.9 C), temperature source Oral, height 5' 2.5" (1.588 m), weight 185 lb (83.9 kg), SpO2 95 %. Gen:      No acute distress HEENT:  EOMI, sclera anicteric Neck:     No masses; no thyromegaly Lungs:    Clear to auscultation bilaterally; normal respiratory effort CV:         Regular rate and rhythm; no murmurs Abd:      + bowel sounds; soft, non-tender; no palpable masses, no distension Ext:    No edema; adequate peripheral perfusion Skin:      Warm and dry; no rash Neuro: alert and oriented x 3 Psych: normal mood and affect   Data Reviewed: Imaging: CT abdomen pelvis [facility] 04/18/2019-visualized lung bases appear clear Chest x-ray [facility] 06/20/2021-lungs are clear, cardiomegaly CT chest 10/22/2021-no acute cardiopulmonary abnormality.  No ILD.  4 mm pulmonary nodules I have reviewed the images personally.  PFTs: 12/05/2021 FVC 2.12 [69%], FEV1 1.82 [76%], F/F 85 TLC 3.79 [78%], DLCO 14.50 [76%] Minimal restriction, diffusion defect  Labs:  Assessment:  Consult for dyspnea Asthma Etiology for dyspnea is  unclear. There is no evidence of interstitial lung disease and cardiac work-up as noted above is normal PFTs with minimal restriction and diffusion impairment which I suspect is from body habitus. She also may have reactive airway disease as her breathing improves with albuterol and prednisone which she gives on occasion for knee pain She has gained weight recently due to immobility after knee problem, which is contributing to dyspnea.  She is due to have arthroscopic knee surgery next month  Check CBC differential, IgE Trial of  Dulera inhaler Advised weight loss with diet.  She will need to get on an exercise regimen after knee surgery  Lung nodules Follow-up CT in 1 year ordered  OSA On CPAP from primary care  Plan/Recommendations: CBC, IgE Elwin Sleight Weight loss  Chilton Greathouse MD [facility] 12/06/2021, 3:31 PM  CC: Philemon Kingdom, MD

## 2021-12-06 NOTE — Patient Instructions (Signed)
We will check some labs today including CBC differential, IgE Start Symbicort 162 puffs in the morning 2 puffs at night Continue to work on weight loss Continue albuterol as needed  Follow-up in 3 months with video visit

## 2021-12-09 ENCOUNTER — Other Ambulatory Visit (HOSPITAL_COMMUNITY): Payer: Self-pay

## 2021-12-09 ENCOUNTER — Telehealth: Payer: Self-pay

## 2021-12-09 LAB — IGE: IgE (Immunoglobulin E), Serum: 7 kU/L (ref <=114)

## 2021-12-09 NOTE — Telephone Encounter (Signed)
Patient Advocate Encounter   Received notification from [facility] that prior authorization for Elwin Sleight is required by his/her insurance [employer].   PA submitted on 12/09/21  Key#: [id number]  Status is pending    [facility] will continue to follow:  Patient Advocate Fax:  [phone]

## 2021-12-17 NOTE — Telephone Encounter (Addendum)
Patient Advocate Encounter  Received notification from [employer] that the request for prior authorization for [facility] 200-71mcg aerosol has been denied because patient must have asthma or COPD, emphysema, chronic bronchitis, and post infectious cough.       I faxed updated chart notes to reflect that the pt has asthma and asked for a reconsideration of the PA denial.  This encounter will continue to be updated until final determination.    Specialty Pharmacy Patient Advocate Fax:  [phone]

## 2021-12-19 DIAGNOSIS — J4540 Moderate persistent asthma, uncomplicated: Secondary | ICD-10-CM | POA: Insufficient documentation

## 2021-12-19 NOTE — Telephone Encounter (Signed)
I added diagnosis of asthma to the clinic note and problem list

## 2021-12-24 ENCOUNTER — Other Ambulatory Visit (HOSPITAL_COMMUNITY): Payer: Self-pay

## 2021-12-24 NOTE — Telephone Encounter (Signed)
Patient Advocate Encounter  Prior Authorization for Dulera 200-70mcg has been approved.    PA# N/A  Effective dates: 12/22/21 through 12/22/22  Per Test Claim Patients co-pay is $60.   Spoke with Pharmacy to Process.  Patient Advocate Fax:  [phone]

## 2021-12-24 NOTE — Telephone Encounter (Signed)
Called and spoke with pt letting her know the info from prior auth team about the PA for her [facility] inhaler and she verbalized understanding. Nothing further needed.

## 2021-12-25 DIAGNOSIS — S83232A Complex tear of medial meniscus, current injury, left knee, initial encounter: Secondary | ICD-10-CM | POA: Diagnosis not present

## 2021-12-25 DIAGNOSIS — S83242A Other tear of medial meniscus, current injury, left knee, initial encounter: Secondary | ICD-10-CM | POA: Diagnosis not present

## 2021-12-25 DIAGNOSIS — M23362 Other meniscus derangements, other lateral meniscus, left knee: Secondary | ICD-10-CM | POA: Diagnosis not present

## 2021-12-25 DIAGNOSIS — G8918 Other acute postprocedural pain: Secondary | ICD-10-CM | POA: Diagnosis not present

## 2021-12-25 DIAGNOSIS — S83282A Other tear of lateral meniscus, current injury, left knee, initial encounter: Secondary | ICD-10-CM | POA: Diagnosis not present

## 2021-12-25 DIAGNOSIS — M94262 Chondromalacia, left knee: Secondary | ICD-10-CM | POA: Diagnosis not present

## 2021-12-27 ENCOUNTER — Other Ambulatory Visit (HOSPITAL_COMMUNITY): Payer: Self-pay

## 2022-01-01 DIAGNOSIS — M25562 Pain in left knee: Secondary | ICD-10-CM | POA: Diagnosis not present

## 2022-01-15 DIAGNOSIS — M25562 Pain in left knee: Secondary | ICD-10-CM | POA: Diagnosis not present

## 2022-01-22 DIAGNOSIS — M25562 Pain in left knee: Secondary | ICD-10-CM | POA: Diagnosis not present

## 2022-01-23 DIAGNOSIS — E785 Hyperlipidemia, unspecified: Secondary | ICD-10-CM | POA: Diagnosis not present

## 2022-01-23 DIAGNOSIS — Z79899 Other long term (current) drug therapy: Secondary | ICD-10-CM | POA: Diagnosis not present

## 2022-01-23 DIAGNOSIS — R7301 Impaired fasting glucose: Secondary | ICD-10-CM | POA: Diagnosis not present

## 2022-01-29 DIAGNOSIS — F419 Anxiety disorder, unspecified: Secondary | ICD-10-CM | POA: Diagnosis not present

## 2022-01-29 DIAGNOSIS — R7301 Impaired fasting glucose: Secondary | ICD-10-CM | POA: Diagnosis not present

## 2022-01-29 DIAGNOSIS — E785 Hyperlipidemia, unspecified: Secondary | ICD-10-CM | POA: Diagnosis not present

## 2022-01-29 DIAGNOSIS — K219 Gastro-esophageal reflux disease without esophagitis: Secondary | ICD-10-CM | POA: Diagnosis not present

## 2022-01-29 DIAGNOSIS — M25562 Pain in left knee: Secondary | ICD-10-CM | POA: Diagnosis not present

## 2022-02-03 DIAGNOSIS — L578 Other skin changes due to chronic exposure to nonionizing radiation: Secondary | ICD-10-CM | POA: Diagnosis not present

## 2022-02-03 DIAGNOSIS — L821 Other seborrheic keratosis: Secondary | ICD-10-CM | POA: Diagnosis not present

## 2022-02-03 DIAGNOSIS — C44319 Basal cell carcinoma of skin of other parts of face: Secondary | ICD-10-CM | POA: Diagnosis not present

## 2022-02-03 DIAGNOSIS — D225 Melanocytic nevi of trunk: Secondary | ICD-10-CM | POA: Diagnosis not present

## 2022-02-03 DIAGNOSIS — L219 Seborrheic dermatitis, unspecified: Secondary | ICD-10-CM | POA: Diagnosis not present

## 2022-02-05 DIAGNOSIS — M25562 Pain in left knee: Secondary | ICD-10-CM | POA: Diagnosis not present

## 2022-03-10 ENCOUNTER — Telehealth: Payer: BC Managed Care – PPO | Admitting: Pulmonary Disease

## 2022-03-14 DIAGNOSIS — Z5189 Encounter for other specified aftercare: Secondary | ICD-10-CM | POA: Diagnosis not present

## 2022-03-27 DIAGNOSIS — M25562 Pain in left knee: Secondary | ICD-10-CM | POA: Diagnosis not present

## 2022-05-15 DIAGNOSIS — R7301 Impaired fasting glucose: Secondary | ICD-10-CM | POA: Diagnosis not present

## 2022-05-15 DIAGNOSIS — Z79899 Other long term (current) drug therapy: Secondary | ICD-10-CM | POA: Diagnosis not present

## 2022-05-15 DIAGNOSIS — E785 Hyperlipidemia, unspecified: Secondary | ICD-10-CM | POA: Diagnosis not present

## 2022-05-22 DIAGNOSIS — M1710 Unilateral primary osteoarthritis, unspecified knee: Secondary | ICD-10-CM | POA: Diagnosis not present

## 2022-05-22 DIAGNOSIS — E785 Hyperlipidemia, unspecified: Secondary | ICD-10-CM | POA: Diagnosis not present

## 2022-05-22 DIAGNOSIS — F419 Anxiety disorder, unspecified: Secondary | ICD-10-CM | POA: Diagnosis not present

## 2022-05-22 DIAGNOSIS — R7301 Impaired fasting glucose: Secondary | ICD-10-CM | POA: Diagnosis not present

## 2022-05-27 DIAGNOSIS — M1712 Unilateral primary osteoarthritis, left knee: Secondary | ICD-10-CM | POA: Diagnosis not present

## 2022-06-11 DIAGNOSIS — R200 Anesthesia of skin: Secondary | ICD-10-CM | POA: Diagnosis not present

## 2022-06-11 DIAGNOSIS — R202 Paresthesia of skin: Secondary | ICD-10-CM | POA: Diagnosis not present

## 2022-06-11 DIAGNOSIS — M5136 Other intervertebral disc degeneration, lumbar region: Secondary | ICD-10-CM | POA: Diagnosis not present

## 2022-06-11 DIAGNOSIS — M5442 Lumbago with sciatica, left side: Secondary | ICD-10-CM | POA: Diagnosis not present

## 2022-06-11 DIAGNOSIS — M47816 Spondylosis without myelopathy or radiculopathy, lumbar region: Secondary | ICD-10-CM | POA: Diagnosis not present

## 2022-06-11 DIAGNOSIS — M4185 Other forms of scoliosis, thoracolumbar region: Secondary | ICD-10-CM | POA: Diagnosis not present

## 2022-06-11 DIAGNOSIS — R9431 Abnormal electrocardiogram [ECG] [EKG]: Secondary | ICD-10-CM | POA: Diagnosis not present

## 2022-06-11 DIAGNOSIS — M5432 Sciatica, left side: Secondary | ICD-10-CM | POA: Diagnosis not present

## 2022-06-11 DIAGNOSIS — M47817 Spondylosis without myelopathy or radiculopathy, lumbosacral region: Secondary | ICD-10-CM | POA: Diagnosis not present

## 2022-06-20 DIAGNOSIS — M5416 Radiculopathy, lumbar region: Secondary | ICD-10-CM | POA: Diagnosis not present

## 2022-06-20 DIAGNOSIS — M7989 Other specified soft tissue disorders: Secondary | ICD-10-CM | POA: Diagnosis not present

## 2022-06-25 DIAGNOSIS — M7989 Other specified soft tissue disorders: Secondary | ICD-10-CM | POA: Diagnosis not present

## 2022-07-09 DIAGNOSIS — E785 Hyperlipidemia, unspecified: Secondary | ICD-10-CM | POA: Diagnosis not present

## 2022-07-09 DIAGNOSIS — R0602 Shortness of breath: Secondary | ICD-10-CM | POA: Diagnosis not present

## 2022-07-09 DIAGNOSIS — K219 Gastro-esophageal reflux disease without esophagitis: Secondary | ICD-10-CM | POA: Diagnosis not present

## 2022-07-09 DIAGNOSIS — F419 Anxiety disorder, unspecified: Secondary | ICD-10-CM | POA: Diagnosis not present

## 2022-08-05 DIAGNOSIS — M25561 Pain in right knee: Secondary | ICD-10-CM | POA: Diagnosis not present

## 2022-08-06 DIAGNOSIS — R0602 Shortness of breath: Secondary | ICD-10-CM | POA: Diagnosis not present

## 2022-08-06 DIAGNOSIS — R635 Abnormal weight gain: Secondary | ICD-10-CM | POA: Diagnosis not present

## 2022-08-13 DIAGNOSIS — Z1331 Encounter for screening for depression: Secondary | ICD-10-CM | POA: Diagnosis not present

## 2022-08-13 DIAGNOSIS — Z0000 Encounter for general adult medical examination without abnormal findings: Secondary | ICD-10-CM | POA: Diagnosis not present

## 2022-08-13 DIAGNOSIS — E785 Hyperlipidemia, unspecified: Secondary | ICD-10-CM | POA: Diagnosis not present

## 2022-08-13 DIAGNOSIS — R7301 Impaired fasting glucose: Secondary | ICD-10-CM | POA: Diagnosis not present

## 2022-08-14 DIAGNOSIS — R0602 Shortness of breath: Secondary | ICD-10-CM | POA: Diagnosis not present

## 2022-08-19 DIAGNOSIS — M25561 Pain in right knee: Secondary | ICD-10-CM | POA: Diagnosis not present

## 2022-09-08 DIAGNOSIS — R0602 Shortness of breath: Secondary | ICD-10-CM | POA: Diagnosis not present

## 2022-09-10 DIAGNOSIS — G8918 Other acute postprocedural pain: Secondary | ICD-10-CM | POA: Diagnosis not present

## 2022-09-10 DIAGNOSIS — M23341 Other meniscus derangements, anterior horn of lateral meniscus, right knee: Secondary | ICD-10-CM | POA: Diagnosis not present

## 2022-09-10 DIAGNOSIS — S83231A Complex tear of medial meniscus, current injury, right knee, initial encounter: Secondary | ICD-10-CM | POA: Diagnosis not present

## 2022-09-10 DIAGNOSIS — M94261 Chondromalacia, right knee: Secondary | ICD-10-CM | POA: Diagnosis not present

## 2022-09-10 DIAGNOSIS — M23331 Other meniscus derangements, other medial meniscus, right knee: Secondary | ICD-10-CM | POA: Diagnosis not present

## 2022-09-10 DIAGNOSIS — M23361 Other meniscus derangements, other lateral meniscus, right knee: Secondary | ICD-10-CM | POA: Diagnosis not present

## 2022-09-10 DIAGNOSIS — S83271A Complex tear of lateral meniscus, current injury, right knee, initial encounter: Secondary | ICD-10-CM | POA: Diagnosis not present

## 2022-09-10 DIAGNOSIS — S83281A Other tear of lateral meniscus, current injury, right knee, initial encounter: Secondary | ICD-10-CM | POA: Diagnosis not present

## 2022-09-10 DIAGNOSIS — S83241A Other tear of medial meniscus, current injury, right knee, initial encounter: Secondary | ICD-10-CM | POA: Diagnosis not present

## 2022-09-14 IMAGING — CT CT CHEST W/O CM
2 of 4 series · 15 of 36 positions shown, 18 images · non-contrast
Comparison: None.

CLINICAL DATA: Dyspnea. Persistent shortness of breath and cough.
Occasional wheezing. Recurrent bronchitis.

EXAM:
CT CHEST WITHOUT CONTRAST
TECHNIQUE: Multidetector CT imaging of the chest was performed following the
standard protocol without IV contrast.

[Series 2: thorax · axial · 0.73mm/px · z∈[-285,-45]mm · 12 of 142 slices shown, 15 images]
[im 11/142  mediastinal]
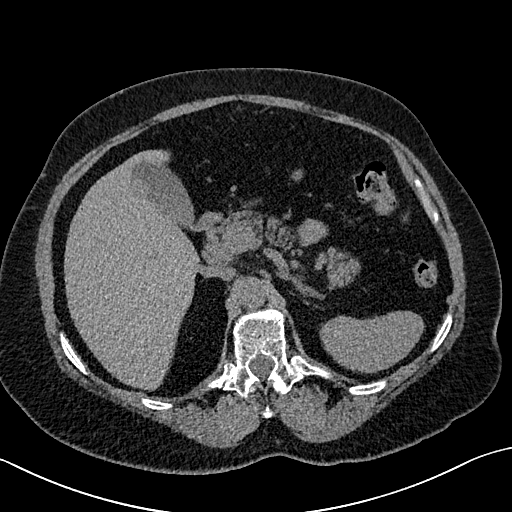
[im 11/142  lung]
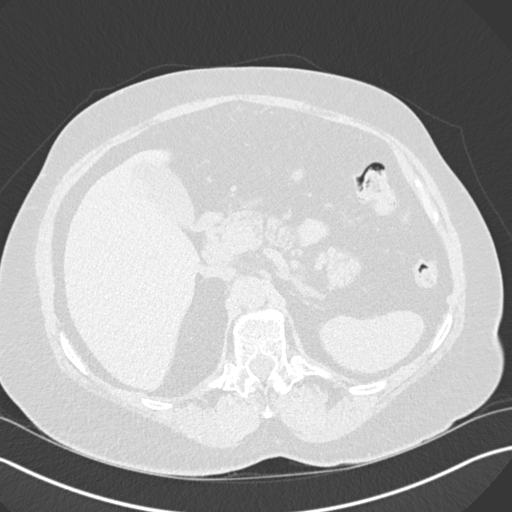
[im 22/142  lung]
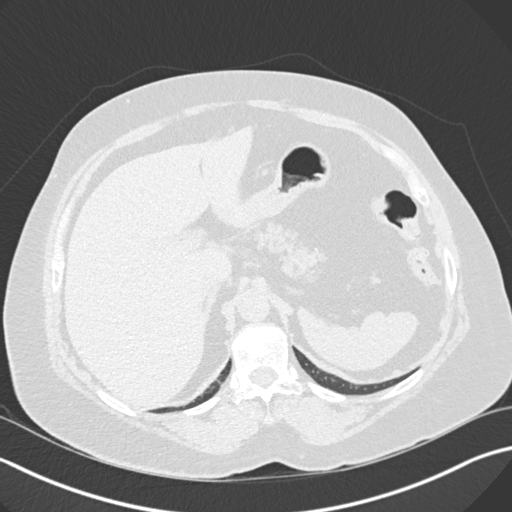
[im 33/142  lung]
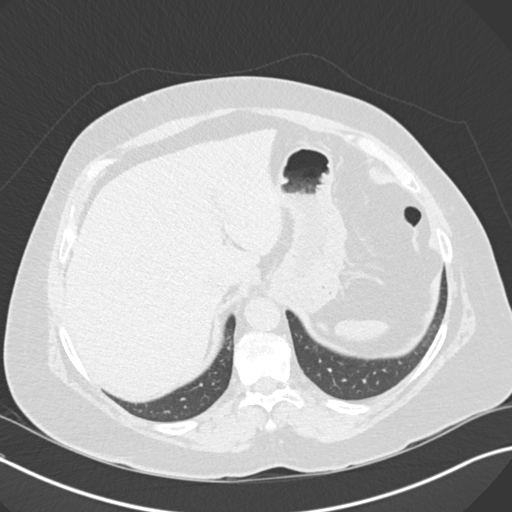
[im 44/142  lung]
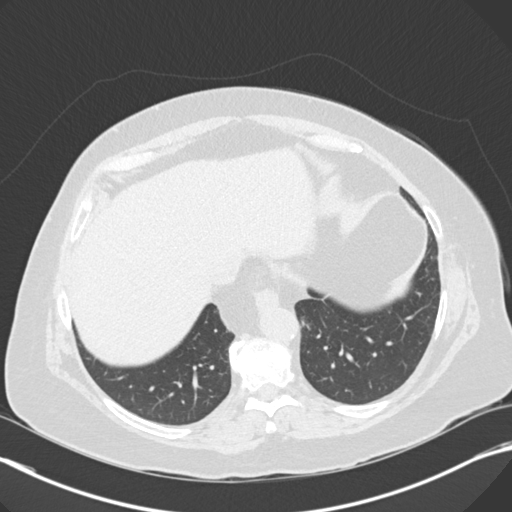
[im 55/142  mediastinal]
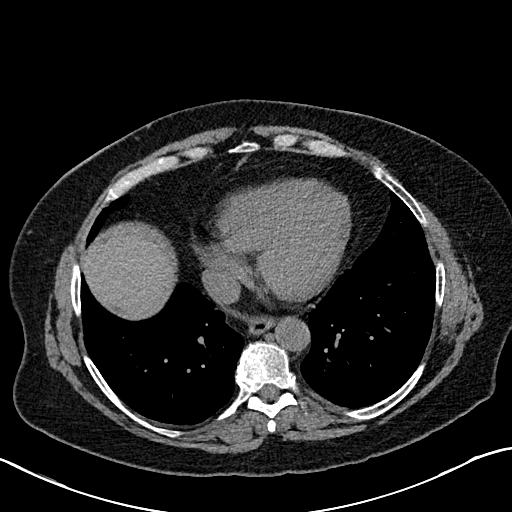
[im 55/142  lung]
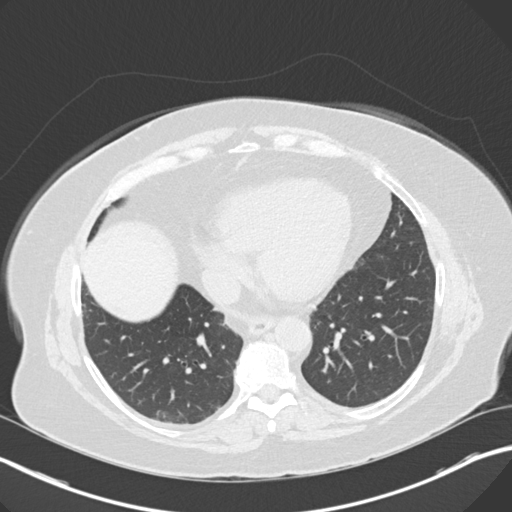
[im 66/142  lung]
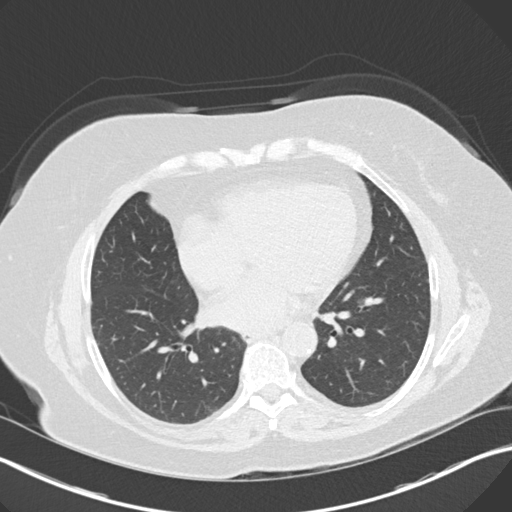
[im 76/142  lung]
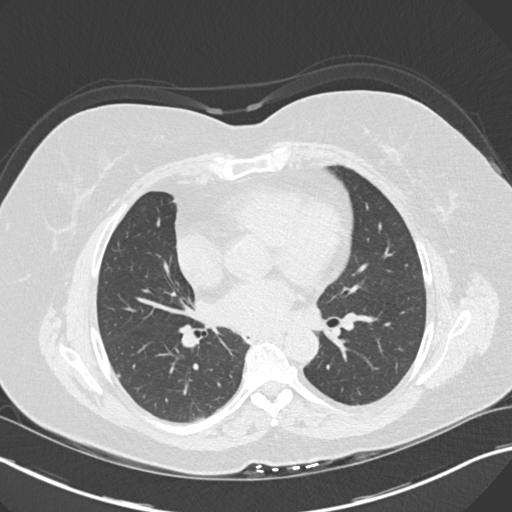
[im 87/142  lung]
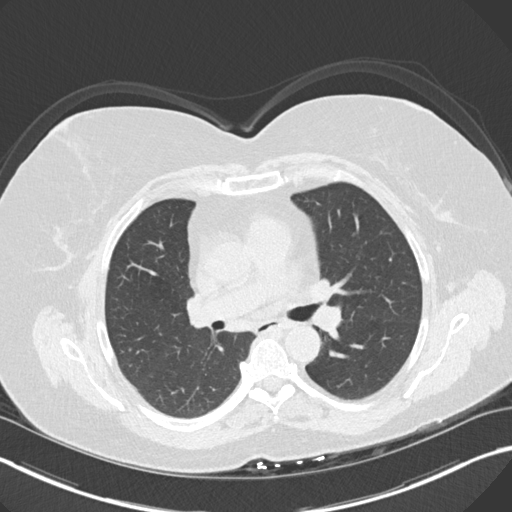
[im 98/142  mediastinal]
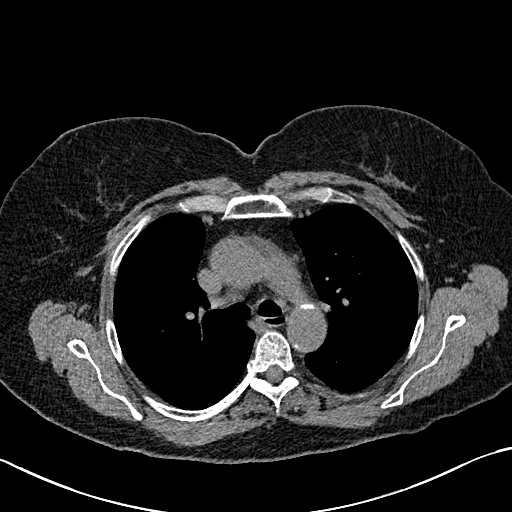
[im 98/142  lung]
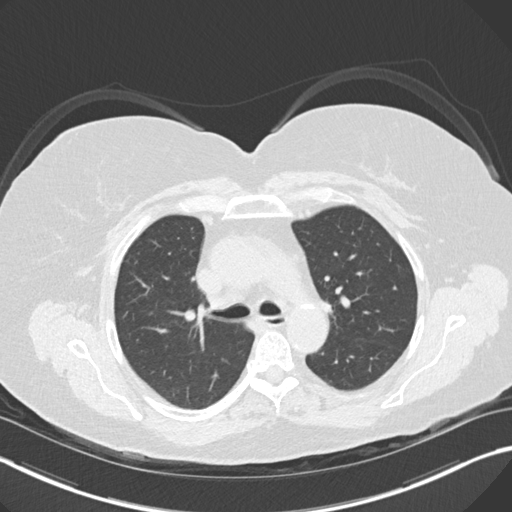
[im 109/142  lung]
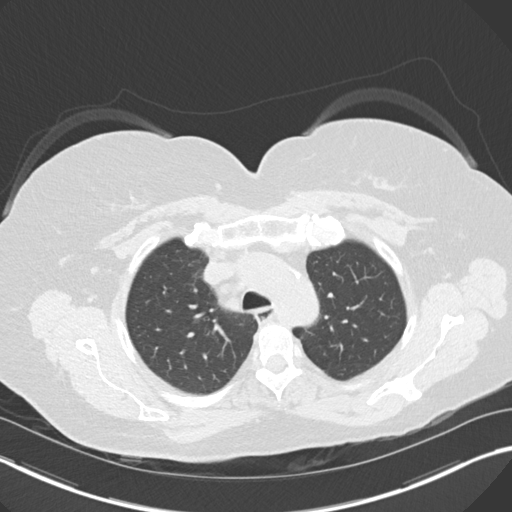
[im 120/142  lung]
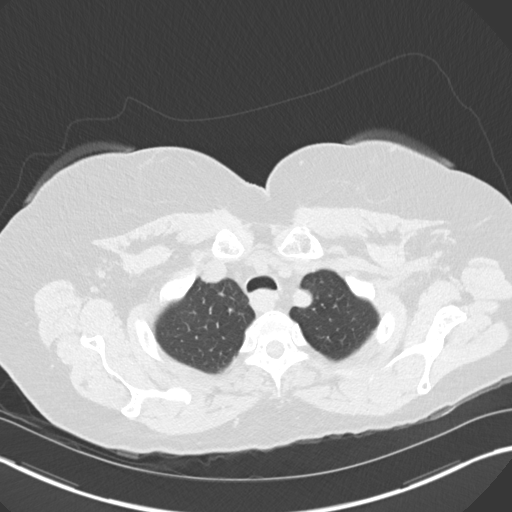
[im 131/142  lung]
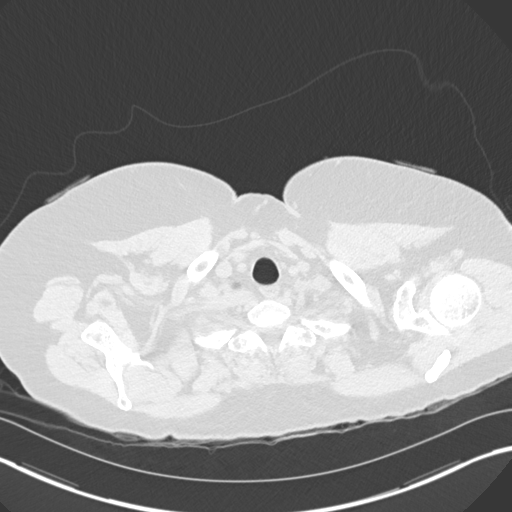

[Series 5: coronal · coronal · 0.54mm/px · 3 of 119 slices shown]
[im 24/119  lung]
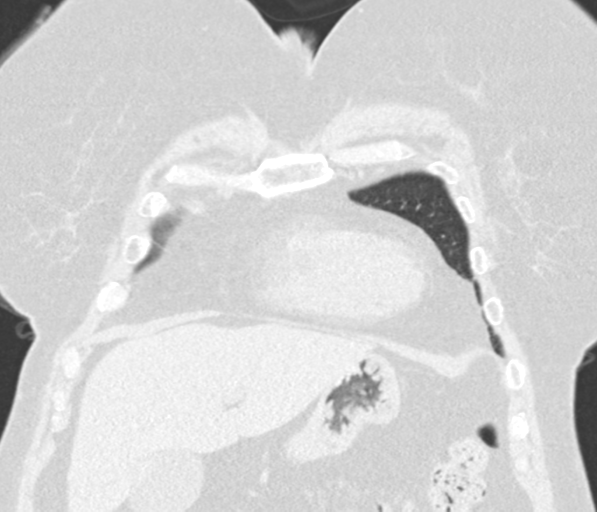
[im 48/119  lung]
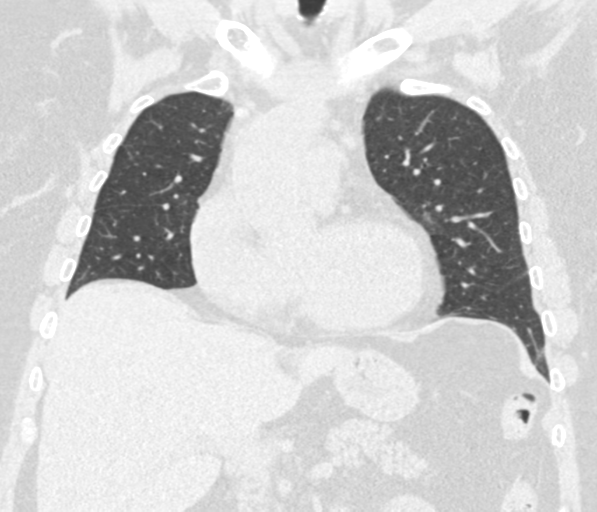
[im 71/119  lung]
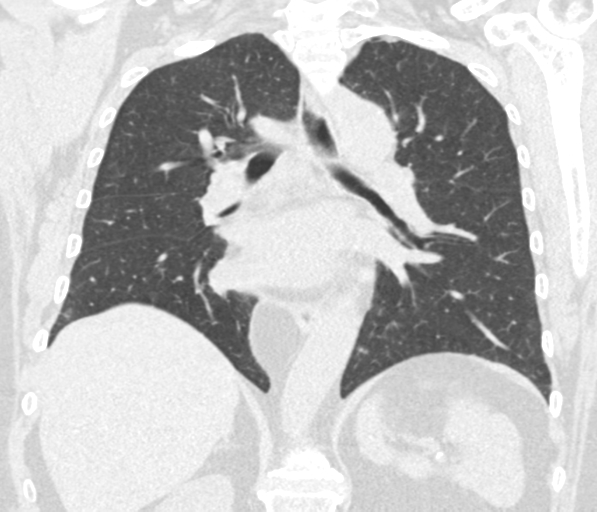

[15 of 36 positions shown; findings below may reference images not displayed]

FINDINGS: Cardiovascular: Normal heart size. Mild aortic atherosclerosis. No
pericardial effusion identified.

Mediastinum/Nodes: No enlarged mediastinal or axillary lymph nodes.
Thyroid gland, trachea, and esophagus demonstrate no significant
findings.

Lungs/Pleura: No pleural effusion, airspace consolidation, or
atelectasis. No pneumothorax. No signs of interstitial lung disease.
Tiny nodule within the subpleural right lower lobe measures 3 mm,
image 67/3. Small sub solid nodule within the subpleural aspect of
the lateral left lower lobe measures 4 mm, image 80/3. Subpleural
nodule within the medial right base measures 4 mm, image 83/3.

Upper Abdomen: No acute abnormality.

Musculoskeletal: No chest wall mass or suspicious bone lesions
identified.
IMPRESSION: 1. No acute cardiopulmonary abnormalities. No signs of interstitial
lung disease.
2. Multiple pulmonary nodules. Most severe: 4 mm left part-solid
pulmonary nodule.
No routine follow-up imaging is recommended per [HOSPITAL]
Guidelines.
These guidelines do not apply to immunocompromised patients and
patients with cancer. Follow up in patients with significant
comorbidities as clinically warranted. For lung cancer screening,
adhere to Lung-RADS guidelines. Reference: Radiology. 2158;
3. Aortic Atherosclerosis (ERF7E-0QL.L).

## 2022-09-17 DIAGNOSIS — S83231A Complex tear of medial meniscus, current injury, right knee, initial encounter: Secondary | ICD-10-CM | POA: Diagnosis not present

## 2022-09-17 DIAGNOSIS — R6889 Other general symptoms and signs: Secondary | ICD-10-CM | POA: Diagnosis not present

## 2022-09-18 ENCOUNTER — Telehealth: Payer: Self-pay | Admitting: Pulmonary Disease

## 2022-09-19 NOTE — Telephone Encounter (Signed)
Called and spoke with patient.  Dr. Matilde Bash recommendations given.  Understanding stated.  Patient scheduled with Dr. Vaughan Browner 10/24/22 at 1000 for follow up after CT.  Nothing further at time.

## 2022-09-19 NOTE — Telephone Encounter (Signed)
I do not believe the MRI will give [place] an adequate look at the lungs so I advise to keep the CT chest appointment.  She will also need a follow-up made as there is no clinic visit scheduled.

## 2022-09-19 NOTE — Telephone Encounter (Signed)
Called and spoke with patient.  Patient stated she is schedule CT chest 10/17/22.  Patient stated she was seen by cardiologist and was told they would be scheduling her an MRI.  MRI is not scheduled at this time, but patient stated she was told it would be soon.  Patient was wanting to know if CT chest needed to be changed or cancelled.  Advised patient to keep CT chest appointment and have Dr. Vaughan Browner advise on any change, if needed.   Message routed to Dr. Vaughan Browner to advise

## 2022-09-24 DIAGNOSIS — Z713 Dietary counseling and surveillance: Secondary | ICD-10-CM | POA: Diagnosis not present

## 2022-09-24 DIAGNOSIS — R7303 Prediabetes: Secondary | ICD-10-CM | POA: Diagnosis not present

## 2022-09-24 DIAGNOSIS — Z6835 Body mass index (BMI) 35.0-35.9, adult: Secondary | ICD-10-CM | POA: Diagnosis not present

## 2022-09-24 DIAGNOSIS — Z7182 Exercise counseling: Secondary | ICD-10-CM | POA: Diagnosis not present

## 2022-09-24 DIAGNOSIS — Z13228 Encounter for screening for other metabolic disorders: Secondary | ICD-10-CM | POA: Diagnosis not present

## 2022-09-26 DIAGNOSIS — R6889 Other general symptoms and signs: Secondary | ICD-10-CM | POA: Diagnosis not present

## 2022-09-26 DIAGNOSIS — S83231A Complex tear of medial meniscus, current injury, right knee, initial encounter: Secondary | ICD-10-CM | POA: Diagnosis not present

## 2022-10-01 DIAGNOSIS — E6609 Other obesity due to excess calories: Secondary | ICD-10-CM | POA: Diagnosis not present

## 2022-10-03 DIAGNOSIS — R6889 Other general symptoms and signs: Secondary | ICD-10-CM | POA: Diagnosis not present

## 2022-10-03 DIAGNOSIS — M25562 Pain in left knee: Secondary | ICD-10-CM | POA: Diagnosis not present

## 2022-10-03 DIAGNOSIS — S83231A Complex tear of medial meniscus, current injury, right knee, initial encounter: Secondary | ICD-10-CM | POA: Diagnosis not present

## 2022-10-15 DIAGNOSIS — R0602 Shortness of breath: Secondary | ICD-10-CM | POA: Diagnosis not present

## 2022-10-15 DIAGNOSIS — R0609 Other forms of dyspnea: Secondary | ICD-10-CM | POA: Diagnosis not present

## 2022-10-17 ENCOUNTER — Ambulatory Visit (HOSPITAL_COMMUNITY)
Admission: RE | Admit: 2022-10-17 | Discharge: 2022-10-17 | Disposition: A | Discharge status: Home / Self Care | Payer: BC Managed Care – PPO | Source: Ambulatory Visit | Attending: Pulmonary Disease | Admitting: Pulmonary Disease

## 2022-10-17 DIAGNOSIS — Z5189 Encounter for other specified aftercare: Secondary | ICD-10-CM | POA: Diagnosis not present

## 2022-10-17 DIAGNOSIS — R918 Other nonspecific abnormal finding of lung field: Secondary | ICD-10-CM

## 2022-10-17 DIAGNOSIS — M1711 Unilateral primary osteoarthritis, right knee: Secondary | ICD-10-CM | POA: Diagnosis not present

## 2022-10-17 DIAGNOSIS — R6889 Other general symptoms and signs: Secondary | ICD-10-CM | POA: Diagnosis not present

## 2022-10-17 DIAGNOSIS — S83231D Complex tear of medial meniscus, current injury, right knee, subsequent encounter: Secondary | ICD-10-CM | POA: Diagnosis not present

## 2022-10-20 DIAGNOSIS — R6889 Other general symptoms and signs: Secondary | ICD-10-CM | POA: Diagnosis not present

## 2022-10-20 DIAGNOSIS — S83231D Complex tear of medial meniscus, current injury, right knee, subsequent encounter: Secondary | ICD-10-CM | POA: Diagnosis not present

## 2022-10-22 DIAGNOSIS — R7303 Prediabetes: Secondary | ICD-10-CM | POA: Diagnosis not present

## 2022-10-22 DIAGNOSIS — E6609 Other obesity due to excess calories: Secondary | ICD-10-CM | POA: Diagnosis not present

## 2022-10-22 DIAGNOSIS — Z6832 Body mass index (BMI) 32.0-32.9, adult: Secondary | ICD-10-CM | POA: Diagnosis not present

## 2022-10-24 ENCOUNTER — Encounter: Payer: Self-pay | Admitting: Pulmonary Disease

## 2022-10-24 ENCOUNTER — Ambulatory Visit: Payer: BC Managed Care – PPO | Admitting: Pulmonary Disease

## 2022-10-24 VITALS — BP 112/74 | HR 92 | Temp 98.1°F | Ht 62.0 in | Wt 180.4 lb

## 2022-10-24 DIAGNOSIS — R0602 Shortness of breath: Secondary | ICD-10-CM

## 2022-10-24 DIAGNOSIS — R918 Other nonspecific abnormal finding of lung field: Secondary | ICD-10-CM | POA: Diagnosis not present

## 2022-10-24 NOTE — Progress Notes (Signed)
Yvette Ortiz    [id number]    1961/02/13  Primary Care Physician:Prochnau, Rayfield Citizen, MD  Referring Physician: Philemon Kingdom, MD [address] [place],  [place] [address]  Chief complaint: Consult for dyspnea  HPI: Ex-smoker with history of hyperlipidemia, GERD, OSA on CPAP Complains of dyspnea for the past 1 to 2 years.  Symptoms are at exertion and bending over.  Denies any cough, sputum production or fevers or chills  She has occasional swelling of the feet and prior imaging that shows cardiomegaly As per the patient she had a cardiac evaluation including echocardiogram at [facility] and was told it was normal She has history of OSA and is compliant with CPAP, primary care  Pets: 3 cats Occupation: Works in a Stage manager Exposures: No mold, hot tub, Jacuzzi.  No feather pillows or comforter Smoking history: Smoked 1 pack/week from 1980- 2007 Travel history: No significant travel history Relevant family history: Mother and father died of lung cancer.  They were smokers.  Interim history: Breathing is better.  She was given Elwin Sleight at last visit but is hardly using it Here for review of CT.  Outpatient Encounter Medications as of 10/24/2022  Medication Sig   albuterol (VENTOLIN HFA) 108 (90 Base) MCG/ACT inhaler SMARTSIG:2 Puff(s) By Mouth 4 Times Daily   ALPRAZolam (XANAX) 0.5 MG tablet Take 0.5 mg by mouth 3 (three) times daily as needed.   atorvastatin (LIPITOR) 40 MG tablet Take 40 mg by mouth daily.   diclofenac (VOLTAREN) 75 MG EC tablet Take 75 mg by mouth 2 (two) times daily.   dicyclomine (BENTYL) 10 MG capsule Take 10 mg by mouth every 6 (six) hours as needed.   FLUoxetine (PROZAC) 40 MG capsule Take 40 mg by mouth daily.   furosemide (LASIX) 20 MG tablet Take 20 mg by mouth daily.   lamoTRIgine (LAMICTAL) 150 MG tablet Take 150 mg by mouth 2 (two) times daily.   metFORMIN (GLUCOPHAGE-XR) 500 MG 24 hr tablet Take 500 mg by mouth  daily with breakfast.   mometasone-formoterol (DULERA) 200-5 MCG/ACT AERO Inhale 2 puffs into the lungs 2 (two) times daily.   naproxen (NAPROSYN) 500 MG tablet Take 500 mg by mouth 2 (two) times daily.   omeprazole (PRILOSEC) 40 MG capsule Take 40 mg by mouth every morning.   phentermine (ADIPEX-P) 37.5 MG tablet Take by mouth.   tiZANidine (ZANAFLEX) 4 MG tablet Take 4 mg by mouth every 8 (eight) hours as needed. (Patient not taking: Reported on 10/24/2022)   No facility-administered encounter medications on file as of 10/24/2022.   Physical Exam: Blood pressure 112/74, pulse 92, temperature 98.1 F (36.7 C), temperature source Oral, height 5\' 2"  (1.575 m), weight 180 lb 6.4 oz (81.8 kg), SpO2 96 %. Gen:      No acute distress HEENT:  EOMI, sclera anicteric Neck:     No masses; no thyromegaly Lungs:    Clear to auscultation bilaterally; normal respiratory effort CV:         Regular rate and rhythm; no murmurs Abd:      + bowel sounds; soft, non-tender; no palpable masses, no distension Ext:    No edema; adequate peripheral perfusion Skin:      Warm and dry; no rash Neuro: alert and oriented x 3 Psych: normal mood and affect   Data Reviewed: Imaging: CT abdomen pelvis 04/18/2019-visualized lung bases appear clear Chest x-ray [facility] 06/20/2021-lungs are clear, cardiomegaly CT  chest 10/22/2021-no acute cardiopulmonary abnormality.  No ILD.  4 mm pulmonary nodules CT chest 10/17/2022-resolution of pulmonary nodularity. I have reviewed the images personally.  PFTs: 12/05/2021 FVC 2.12 [69%], FEV1 1.82 [76%], F/F 85 TLC 3.79 [78%], DLCO 14.50 [76%] Minimal restriction, diffusion defect  Labs:  Assessment:  Consult for dyspnea Etiology for dyspnea is unclear. There is no evidence of interstitial lung disease and cardiac work-up as noted above is normal PFTs with minimal restriction and diffusion impairment which I suspect is from body habitus. We considered asthma but  there is no clear evidence of it as PFTs did not show obstruction and there is no eosinophilia or elevated IgE Inhalers have not helped and she will stop  Advised exercise regimen and weight loss.  Lung nodules Follow-up CT shows resolution of lung nodules  OSA On CPAP from primary care  Plan/Recommendations: Weight loss Follow-up as needed  Marshell Garfinkel MD [facility] 10/24/2022, 10:15 AM  CC: Ernestene Kiel, MD

## 2022-10-24 NOTE — Patient Instructions (Addendum)
I am glad you are stable with your breathing Your CT scan shows resolution of lung nodules which is good news You can follow-up in clinic as needed.  Suggest follow-up asthma

## 2022-10-27 DIAGNOSIS — R635 Abnormal weight gain: Secondary | ICD-10-CM | POA: Diagnosis not present

## 2022-10-27 DIAGNOSIS — R0602 Shortness of breath: Secondary | ICD-10-CM | POA: Diagnosis not present

## 2022-10-30 DIAGNOSIS — R7303 Prediabetes: Secondary | ICD-10-CM | POA: Diagnosis not present

## 2022-10-30 DIAGNOSIS — S83231D Complex tear of medial meniscus, current injury, right knee, subsequent encounter: Secondary | ICD-10-CM | POA: Diagnosis not present

## 2022-10-30 DIAGNOSIS — Z713 Dietary counseling and surveillance: Secondary | ICD-10-CM | POA: Diagnosis not present

## 2022-10-30 DIAGNOSIS — E6609 Other obesity due to excess calories: Secondary | ICD-10-CM | POA: Diagnosis not present

## 2022-10-30 DIAGNOSIS — R6889 Other general symptoms and signs: Secondary | ICD-10-CM | POA: Diagnosis not present

## 2022-11-06 DIAGNOSIS — R6889 Other general symptoms and signs: Secondary | ICD-10-CM | POA: Diagnosis not present

## 2022-11-06 DIAGNOSIS — S83231D Complex tear of medial meniscus, current injury, right knee, subsequent encounter: Secondary | ICD-10-CM | POA: Diagnosis not present

## 2022-11-10 DIAGNOSIS — Z6832 Body mass index (BMI) 32.0-32.9, adult: Secondary | ICD-10-CM | POA: Diagnosis not present

## 2022-11-10 DIAGNOSIS — R5383 Other fatigue: Secondary | ICD-10-CM | POA: Diagnosis not present

## 2022-11-10 DIAGNOSIS — R7303 Prediabetes: Secondary | ICD-10-CM | POA: Diagnosis not present

## 2022-11-10 DIAGNOSIS — E6609 Other obesity due to excess calories: Secondary | ICD-10-CM | POA: Diagnosis not present

## 2022-12-05 DIAGNOSIS — K529 Noninfective gastroenteritis and colitis, unspecified: Secondary | ICD-10-CM | POA: Diagnosis not present

## 2022-12-05 DIAGNOSIS — R5382 Chronic fatigue, unspecified: Secondary | ICD-10-CM | POA: Diagnosis not present

## 2022-12-05 DIAGNOSIS — R110 Nausea: Secondary | ICD-10-CM | POA: Diagnosis not present

## 2022-12-05 DIAGNOSIS — K219 Gastro-esophageal reflux disease without esophagitis: Secondary | ICD-10-CM | POA: Diagnosis not present

## 2022-12-09 DIAGNOSIS — E6609 Other obesity due to excess calories: Secondary | ICD-10-CM | POA: Diagnosis not present

## 2022-12-09 DIAGNOSIS — Z6831 Body mass index (BMI) 31.0-31.9, adult: Secondary | ICD-10-CM | POA: Diagnosis not present

## 2022-12-09 DIAGNOSIS — G4733 Obstructive sleep apnea (adult) (pediatric): Secondary | ICD-10-CM | POA: Diagnosis not present

## 2022-12-09 DIAGNOSIS — R7303 Prediabetes: Secondary | ICD-10-CM | POA: Diagnosis not present

## 2022-12-11 DIAGNOSIS — Z789 Other specified health status: Secondary | ICD-10-CM | POA: Diagnosis not present

## 2022-12-11 DIAGNOSIS — E6609 Other obesity due to excess calories: Secondary | ICD-10-CM | POA: Diagnosis not present

## 2022-12-11 DIAGNOSIS — R7303 Prediabetes: Secondary | ICD-10-CM | POA: Diagnosis not present

## 2022-12-16 DIAGNOSIS — Z1231 Encounter for screening mammogram for malignant neoplasm of breast: Secondary | ICD-10-CM | POA: Diagnosis not present

## 2023-01-06 DIAGNOSIS — F439 Reaction to severe stress, unspecified: Secondary | ICD-10-CM | POA: Diagnosis not present

## 2023-01-06 DIAGNOSIS — Z6830 Body mass index (BMI) 30.0-30.9, adult: Secondary | ICD-10-CM | POA: Diagnosis not present

## 2023-01-06 DIAGNOSIS — E6609 Other obesity due to excess calories: Secondary | ICD-10-CM | POA: Diagnosis not present

## 2023-01-08 DIAGNOSIS — R7303 Prediabetes: Secondary | ICD-10-CM | POA: Diagnosis not present

## 2023-01-08 DIAGNOSIS — Z789 Other specified health status: Secondary | ICD-10-CM | POA: Diagnosis not present

## 2023-01-08 DIAGNOSIS — Z713 Dietary counseling and surveillance: Secondary | ICD-10-CM | POA: Diagnosis not present

## 2023-02-06 DIAGNOSIS — Z1330 Encounter for screening examination for mental health and behavioral disorders, unspecified: Secondary | ICD-10-CM | POA: Diagnosis not present

## 2023-02-06 DIAGNOSIS — R0602 Shortness of breath: Secondary | ICD-10-CM | POA: Diagnosis not present

## 2023-02-06 DIAGNOSIS — I10 Essential (primary) hypertension: Secondary | ICD-10-CM | POA: Diagnosis not present

## 2023-02-06 DIAGNOSIS — G4733 Obstructive sleep apnea (adult) (pediatric): Secondary | ICD-10-CM | POA: Diagnosis not present

## 2023-02-06 DIAGNOSIS — M1711 Unilateral primary osteoarthritis, right knee: Secondary | ICD-10-CM | POA: Diagnosis not present

## 2023-02-18 DIAGNOSIS — R7301 Impaired fasting glucose: Secondary | ICD-10-CM | POA: Diagnosis not present

## 2023-02-18 DIAGNOSIS — E785 Hyperlipidemia, unspecified: Secondary | ICD-10-CM | POA: Diagnosis not present

## 2023-02-18 DIAGNOSIS — I10 Essential (primary) hypertension: Secondary | ICD-10-CM | POA: Diagnosis not present

## 2023-02-18 DIAGNOSIS — Z79899 Other long term (current) drug therapy: Secondary | ICD-10-CM | POA: Diagnosis not present

## 2023-02-18 DIAGNOSIS — I700 Atherosclerosis of aorta: Secondary | ICD-10-CM | POA: Diagnosis not present

## 2023-02-19 DIAGNOSIS — R7303 Prediabetes: Secondary | ICD-10-CM | POA: Diagnosis not present

## 2023-02-19 DIAGNOSIS — I10 Essential (primary) hypertension: Secondary | ICD-10-CM | POA: Diagnosis not present

## 2023-02-19 DIAGNOSIS — Z7182 Exercise counseling: Secondary | ICD-10-CM | POA: Diagnosis not present

## 2023-02-19 DIAGNOSIS — Z6829 Body mass index (BMI) 29.0-29.9, adult: Secondary | ICD-10-CM | POA: Diagnosis not present

## 2023-02-19 DIAGNOSIS — E663 Overweight: Secondary | ICD-10-CM | POA: Diagnosis not present

## 2023-02-19 DIAGNOSIS — Z713 Dietary counseling and surveillance: Secondary | ICD-10-CM | POA: Diagnosis not present

## 2023-02-20 DIAGNOSIS — M1711 Unilateral primary osteoarthritis, right knee: Secondary | ICD-10-CM | POA: Diagnosis not present

## 2023-02-23 DIAGNOSIS — R1084 Generalized abdominal pain: Secondary | ICD-10-CM | POA: Diagnosis not present

## 2023-02-23 DIAGNOSIS — K591 Functional diarrhea: Secondary | ICD-10-CM | POA: Diagnosis not present

## 2023-02-23 DIAGNOSIS — R112 Nausea with vomiting, unspecified: Secondary | ICD-10-CM | POA: Diagnosis not present

## 2023-03-05 DIAGNOSIS — E876 Hypokalemia: Secondary | ICD-10-CM | POA: Diagnosis not present

## 2023-03-16 DIAGNOSIS — N390 Urinary tract infection, site not specified: Secondary | ICD-10-CM | POA: Diagnosis not present

## 2023-03-17 DIAGNOSIS — N390 Urinary tract infection, site not specified: Secondary | ICD-10-CM | POA: Diagnosis not present

## 2023-03-19 DIAGNOSIS — N390 Urinary tract infection, site not specified: Secondary | ICD-10-CM | POA: Diagnosis not present

## 2023-03-19 DIAGNOSIS — R1084 Generalized abdominal pain: Secondary | ICD-10-CM | POA: Diagnosis not present

## 2023-03-19 DIAGNOSIS — N939 Abnormal uterine and vaginal bleeding, unspecified: Secondary | ICD-10-CM | POA: Diagnosis not present

## 2023-03-19 DIAGNOSIS — R7301 Impaired fasting glucose: Secondary | ICD-10-CM | POA: Diagnosis not present

## 2023-03-24 DIAGNOSIS — Z7182 Exercise counseling: Secondary | ICD-10-CM | POA: Diagnosis not present

## 2023-03-24 DIAGNOSIS — E663 Overweight: Secondary | ICD-10-CM | POA: Diagnosis not present

## 2023-03-24 DIAGNOSIS — I10 Essential (primary) hypertension: Secondary | ICD-10-CM | POA: Diagnosis not present

## 2023-03-24 DIAGNOSIS — Z6829 Body mass index (BMI) 29.0-29.9, adult: Secondary | ICD-10-CM | POA: Diagnosis not present

## 2023-03-24 DIAGNOSIS — Z713 Dietary counseling and surveillance: Secondary | ICD-10-CM | POA: Diagnosis not present

## 2023-03-24 DIAGNOSIS — R7303 Prediabetes: Secondary | ICD-10-CM | POA: Diagnosis not present

## 2023-04-02 DIAGNOSIS — N939 Abnormal uterine and vaginal bleeding, unspecified: Secondary | ICD-10-CM | POA: Diagnosis not present

## 2023-04-02 DIAGNOSIS — N921 Excessive and frequent menstruation with irregular cycle: Secondary | ICD-10-CM | POA: Diagnosis not present

## 2023-04-15 DIAGNOSIS — L821 Other seborrheic keratosis: Secondary | ICD-10-CM | POA: Diagnosis not present

## 2023-04-15 DIAGNOSIS — D225 Melanocytic nevi of trunk: Secondary | ICD-10-CM | POA: Diagnosis not present

## 2023-04-15 DIAGNOSIS — L570 Actinic keratosis: Secondary | ICD-10-CM | POA: Diagnosis not present

## 2023-04-15 DIAGNOSIS — L814 Other melanin hyperpigmentation: Secondary | ICD-10-CM | POA: Diagnosis not present

## 2023-04-22 DIAGNOSIS — R051 Acute cough: Secondary | ICD-10-CM | POA: Diagnosis not present

## 2023-04-22 DIAGNOSIS — R070 Pain in throat: Secondary | ICD-10-CM | POA: Diagnosis not present

## 2023-04-22 DIAGNOSIS — J069 Acute upper respiratory infection, unspecified: Secondary | ICD-10-CM | POA: Diagnosis not present

## 2023-05-08 DIAGNOSIS — R519 Headache, unspecified: Secondary | ICD-10-CM | POA: Diagnosis not present

## 2023-05-08 DIAGNOSIS — J069 Acute upper respiratory infection, unspecified: Secondary | ICD-10-CM | POA: Diagnosis not present

## 2023-05-08 DIAGNOSIS — R070 Pain in throat: Secondary | ICD-10-CM | POA: Diagnosis not present

## 2023-06-14 DIAGNOSIS — J309 Allergic rhinitis, unspecified: Secondary | ICD-10-CM | POA: Diagnosis not present

## 2023-06-17 DIAGNOSIS — Z79899 Other long term (current) drug therapy: Secondary | ICD-10-CM | POA: Diagnosis not present

## 2023-07-01 DIAGNOSIS — E7800 Pure hypercholesterolemia, unspecified: Secondary | ICD-10-CM | POA: Diagnosis not present

## 2023-07-01 DIAGNOSIS — R932 Abnormal findings on diagnostic imaging of liver and biliary tract: Secondary | ICD-10-CM | POA: Diagnosis not present

## 2023-07-01 DIAGNOSIS — R1032 Left lower quadrant pain: Secondary | ICD-10-CM | POA: Diagnosis not present

## 2023-07-01 DIAGNOSIS — Z79899 Other long term (current) drug therapy: Secondary | ICD-10-CM | POA: Diagnosis not present

## 2023-07-01 DIAGNOSIS — K769 Liver disease, unspecified: Secondary | ICD-10-CM | POA: Diagnosis not present

## 2023-07-01 DIAGNOSIS — K5730 Diverticulosis of large intestine without perforation or abscess without bleeding: Secondary | ICD-10-CM | POA: Diagnosis not present

## 2023-07-01 DIAGNOSIS — R109 Unspecified abdominal pain: Secondary | ICD-10-CM | POA: Diagnosis not present

## 2023-07-07 DIAGNOSIS — Z6831 Body mass index (BMI) 31.0-31.9, adult: Secondary | ICD-10-CM | POA: Diagnosis not present

## 2023-07-07 DIAGNOSIS — M25561 Pain in right knee: Secondary | ICD-10-CM | POA: Diagnosis not present

## 2023-07-07 DIAGNOSIS — Z9109 Other allergy status, other than to drugs and biological substances: Secondary | ICD-10-CM | POA: Diagnosis not present

## 2023-07-07 DIAGNOSIS — R109 Unspecified abdominal pain: Secondary | ICD-10-CM | POA: Diagnosis not present

## 2023-07-21 DIAGNOSIS — M25561 Pain in right knee: Secondary | ICD-10-CM | POA: Diagnosis not present

## 2023-07-21 DIAGNOSIS — M1711 Unilateral primary osteoarthritis, right knee: Secondary | ICD-10-CM | POA: Diagnosis not present

## 2023-08-05 DIAGNOSIS — Z6831 Body mass index (BMI) 31.0-31.9, adult: Secondary | ICD-10-CM | POA: Diagnosis not present

## 2023-08-05 DIAGNOSIS — G4733 Obstructive sleep apnea (adult) (pediatric): Secondary | ICD-10-CM | POA: Diagnosis not present

## 2023-08-05 DIAGNOSIS — Z01818 Encounter for other preprocedural examination: Secondary | ICD-10-CM | POA: Diagnosis not present

## 2023-08-05 DIAGNOSIS — Z79899 Other long term (current) drug therapy: Secondary | ICD-10-CM | POA: Diagnosis not present

## 2023-08-06 DIAGNOSIS — Z79899 Other long term (current) drug therapy: Secondary | ICD-10-CM | POA: Diagnosis not present

## 2023-08-06 DIAGNOSIS — Z01818 Encounter for other preprocedural examination: Secondary | ICD-10-CM | POA: Diagnosis not present

## 2023-08-12 DIAGNOSIS — M1711 Unilateral primary osteoarthritis, right knee: Secondary | ICD-10-CM | POA: Diagnosis not present

## 2023-08-12 DIAGNOSIS — M25561 Pain in right knee: Secondary | ICD-10-CM | POA: Diagnosis not present

## 2023-08-12 DIAGNOSIS — R6889 Other general symptoms and signs: Secondary | ICD-10-CM | POA: Diagnosis not present

## 2023-08-18 DIAGNOSIS — M1711 Unilateral primary osteoarthritis, right knee: Secondary | ICD-10-CM | POA: Diagnosis not present

## 2023-08-19 DIAGNOSIS — M25561 Pain in right knee: Secondary | ICD-10-CM | POA: Diagnosis not present

## 2023-08-19 DIAGNOSIS — G8918 Other acute postprocedural pain: Secondary | ICD-10-CM | POA: Diagnosis not present

## 2023-08-19 DIAGNOSIS — M1711 Unilateral primary osteoarthritis, right knee: Secondary | ICD-10-CM | POA: Diagnosis not present

## 2023-08-21 DIAGNOSIS — R2689 Other abnormalities of gait and mobility: Secondary | ICD-10-CM | POA: Diagnosis not present

## 2023-08-21 DIAGNOSIS — M25561 Pain in right knee: Secondary | ICD-10-CM | POA: Diagnosis not present

## 2023-08-21 DIAGNOSIS — Z96651 Presence of right artificial knee joint: Secondary | ICD-10-CM | POA: Diagnosis not present

## 2023-08-25 DIAGNOSIS — M25561 Pain in right knee: Secondary | ICD-10-CM | POA: Diagnosis not present

## 2023-08-25 DIAGNOSIS — R2689 Other abnormalities of gait and mobility: Secondary | ICD-10-CM | POA: Diagnosis not present

## 2023-08-25 DIAGNOSIS — Z96651 Presence of right artificial knee joint: Secondary | ICD-10-CM | POA: Diagnosis not present

## 2023-08-28 DIAGNOSIS — M25561 Pain in right knee: Secondary | ICD-10-CM | POA: Diagnosis not present

## 2023-08-28 DIAGNOSIS — Z96651 Presence of right artificial knee joint: Secondary | ICD-10-CM | POA: Diagnosis not present

## 2023-08-28 DIAGNOSIS — R2689 Other abnormalities of gait and mobility: Secondary | ICD-10-CM | POA: Diagnosis not present

## 2023-08-31 DIAGNOSIS — M25561 Pain in right knee: Secondary | ICD-10-CM | POA: Diagnosis not present

## 2023-08-31 DIAGNOSIS — Z96651 Presence of right artificial knee joint: Secondary | ICD-10-CM | POA: Diagnosis not present

## 2023-08-31 DIAGNOSIS — R2689 Other abnormalities of gait and mobility: Secondary | ICD-10-CM | POA: Diagnosis not present

## 2023-09-03 DIAGNOSIS — R2689 Other abnormalities of gait and mobility: Secondary | ICD-10-CM | POA: Diagnosis not present

## 2023-09-03 DIAGNOSIS — Z96651 Presence of right artificial knee joint: Secondary | ICD-10-CM | POA: Diagnosis not present

## 2023-09-03 DIAGNOSIS — M25561 Pain in right knee: Secondary | ICD-10-CM | POA: Diagnosis not present

## 2023-09-04 DIAGNOSIS — Z96651 Presence of right artificial knee joint: Secondary | ICD-10-CM | POA: Diagnosis not present

## 2023-09-09 DIAGNOSIS — R2689 Other abnormalities of gait and mobility: Secondary | ICD-10-CM | POA: Diagnosis not present

## 2023-09-09 DIAGNOSIS — Z96651 Presence of right artificial knee joint: Secondary | ICD-10-CM | POA: Diagnosis not present

## 2023-09-09 DIAGNOSIS — M25561 Pain in right knee: Secondary | ICD-10-CM | POA: Diagnosis not present

## 2023-09-11 DIAGNOSIS — Z96651 Presence of right artificial knee joint: Secondary | ICD-10-CM | POA: Diagnosis not present

## 2023-09-11 DIAGNOSIS — M25561 Pain in right knee: Secondary | ICD-10-CM | POA: Diagnosis not present

## 2023-09-11 DIAGNOSIS — G4733 Obstructive sleep apnea (adult) (pediatric): Secondary | ICD-10-CM | POA: Diagnosis not present

## 2023-09-11 DIAGNOSIS — R2689 Other abnormalities of gait and mobility: Secondary | ICD-10-CM | POA: Diagnosis not present

## 2023-09-13 DIAGNOSIS — M25561 Pain in right knee: Secondary | ICD-10-CM | POA: Diagnosis not present

## 2023-09-15 DIAGNOSIS — M25561 Pain in right knee: Secondary | ICD-10-CM | POA: Diagnosis not present

## 2023-09-15 DIAGNOSIS — Z96651 Presence of right artificial knee joint: Secondary | ICD-10-CM | POA: Diagnosis not present

## 2023-09-15 DIAGNOSIS — R2689 Other abnormalities of gait and mobility: Secondary | ICD-10-CM | POA: Diagnosis not present

## 2023-09-17 DIAGNOSIS — R2689 Other abnormalities of gait and mobility: Secondary | ICD-10-CM | POA: Diagnosis not present

## 2023-09-17 DIAGNOSIS — M25561 Pain in right knee: Secondary | ICD-10-CM | POA: Diagnosis not present

## 2023-09-17 DIAGNOSIS — Z96651 Presence of right artificial knee joint: Secondary | ICD-10-CM | POA: Diagnosis not present

## 2023-09-22 DIAGNOSIS — R2689 Other abnormalities of gait and mobility: Secondary | ICD-10-CM | POA: Diagnosis not present

## 2023-09-22 DIAGNOSIS — Z96651 Presence of right artificial knee joint: Secondary | ICD-10-CM | POA: Diagnosis not present

## 2023-09-22 DIAGNOSIS — M25561 Pain in right knee: Secondary | ICD-10-CM | POA: Diagnosis not present

## 2023-09-24 DIAGNOSIS — R2689 Other abnormalities of gait and mobility: Secondary | ICD-10-CM | POA: Diagnosis not present

## 2023-09-24 DIAGNOSIS — M25561 Pain in right knee: Secondary | ICD-10-CM | POA: Diagnosis not present

## 2023-09-24 DIAGNOSIS — Z96651 Presence of right artificial knee joint: Secondary | ICD-10-CM | POA: Diagnosis not present

## 2023-09-30 DIAGNOSIS — R2689 Other abnormalities of gait and mobility: Secondary | ICD-10-CM | POA: Diagnosis not present

## 2023-09-30 DIAGNOSIS — Z96651 Presence of right artificial knee joint: Secondary | ICD-10-CM | POA: Diagnosis not present

## 2023-09-30 DIAGNOSIS — M25561 Pain in right knee: Secondary | ICD-10-CM | POA: Diagnosis not present

## 2023-10-07 DIAGNOSIS — Z96651 Presence of right artificial knee joint: Secondary | ICD-10-CM | POA: Diagnosis not present

## 2023-10-07 DIAGNOSIS — M25561 Pain in right knee: Secondary | ICD-10-CM | POA: Diagnosis not present

## 2023-10-07 DIAGNOSIS — R2689 Other abnormalities of gait and mobility: Secondary | ICD-10-CM | POA: Diagnosis not present

## 2023-10-11 DIAGNOSIS — G4733 Obstructive sleep apnea (adult) (pediatric): Secondary | ICD-10-CM | POA: Diagnosis not present

## 2023-10-14 DIAGNOSIS — Z96651 Presence of right artificial knee joint: Secondary | ICD-10-CM | POA: Diagnosis not present

## 2023-10-14 DIAGNOSIS — F5101 Primary insomnia: Secondary | ICD-10-CM | POA: Diagnosis not present

## 2023-10-14 DIAGNOSIS — R2689 Other abnormalities of gait and mobility: Secondary | ICD-10-CM | POA: Diagnosis not present

## 2023-10-14 DIAGNOSIS — M47816 Spondylosis without myelopathy or radiculopathy, lumbar region: Secondary | ICD-10-CM | POA: Diagnosis not present

## 2023-10-14 DIAGNOSIS — G4733 Obstructive sleep apnea (adult) (pediatric): Secondary | ICD-10-CM | POA: Diagnosis not present

## 2023-10-14 DIAGNOSIS — M25561 Pain in right knee: Secondary | ICD-10-CM | POA: Diagnosis not present

## 2023-10-14 DIAGNOSIS — R12 Heartburn: Secondary | ICD-10-CM | POA: Diagnosis not present

## 2023-10-21 DIAGNOSIS — Z96651 Presence of right artificial knee joint: Secondary | ICD-10-CM | POA: Diagnosis not present

## 2023-10-21 DIAGNOSIS — M25561 Pain in right knee: Secondary | ICD-10-CM | POA: Diagnosis not present

## 2023-10-21 DIAGNOSIS — R2689 Other abnormalities of gait and mobility: Secondary | ICD-10-CM | POA: Diagnosis not present

## 2023-11-11 DIAGNOSIS — G4733 Obstructive sleep apnea (adult) (pediatric): Secondary | ICD-10-CM | POA: Diagnosis not present

## 2023-12-11 DIAGNOSIS — G4733 Obstructive sleep apnea (adult) (pediatric): Secondary | ICD-10-CM | POA: Diagnosis not present

## 2023-12-22 DIAGNOSIS — Z1231 Encounter for screening mammogram for malignant neoplasm of breast: Secondary | ICD-10-CM | POA: Diagnosis not present

## 2024-01-07 DIAGNOSIS — M67811 Other specified disorders of synovium, right shoulder: Secondary | ICD-10-CM | POA: Diagnosis not present

## 2024-01-07 DIAGNOSIS — M25511 Pain in right shoulder: Secondary | ICD-10-CM | POA: Diagnosis not present

## 2024-01-07 DIAGNOSIS — M67812 Other specified disorders of synovium, left shoulder: Secondary | ICD-10-CM | POA: Diagnosis not present

## 2024-01-07 DIAGNOSIS — M25512 Pain in left shoulder: Secondary | ICD-10-CM | POA: Diagnosis not present

## 2024-01-11 DIAGNOSIS — G4733 Obstructive sleep apnea (adult) (pediatric): Secondary | ICD-10-CM | POA: Diagnosis not present

## 2024-01-15 DIAGNOSIS — Z96651 Presence of right artificial knee joint: Secondary | ICD-10-CM | POA: Diagnosis not present

## 2024-01-19 DIAGNOSIS — R070 Pain in throat: Secondary | ICD-10-CM | POA: Diagnosis not present

## 2024-01-19 DIAGNOSIS — R0981 Nasal congestion: Secondary | ICD-10-CM | POA: Diagnosis not present

## 2024-01-19 DIAGNOSIS — R051 Acute cough: Secondary | ICD-10-CM | POA: Diagnosis not present

## 2024-01-23 DIAGNOSIS — R051 Acute cough: Secondary | ICD-10-CM | POA: Diagnosis not present

## 2024-01-23 DIAGNOSIS — R0602 Shortness of breath: Secondary | ICD-10-CM | POA: Diagnosis not present

## 2024-01-23 DIAGNOSIS — R0981 Nasal congestion: Secondary | ICD-10-CM | POA: Diagnosis not present

## 2024-01-31 DIAGNOSIS — R0981 Nasal congestion: Secondary | ICD-10-CM | POA: Diagnosis not present

## 2024-01-31 DIAGNOSIS — R42 Dizziness and giddiness: Secondary | ICD-10-CM | POA: Diagnosis not present

## 2024-02-02 DIAGNOSIS — Z6834 Body mass index (BMI) 34.0-34.9, adult: Secondary | ICD-10-CM | POA: Diagnosis not present

## 2024-02-02 DIAGNOSIS — R062 Wheezing: Secondary | ICD-10-CM | POA: Diagnosis not present

## 2024-02-02 DIAGNOSIS — K219 Gastro-esophageal reflux disease without esophagitis: Secondary | ICD-10-CM | POA: Diagnosis not present

## 2024-02-02 DIAGNOSIS — J40 Bronchitis, not specified as acute or chronic: Secondary | ICD-10-CM | POA: Diagnosis not present

## 2024-02-04 DIAGNOSIS — J180 Bronchopneumonia, unspecified organism: Secondary | ICD-10-CM | POA: Diagnosis not present

## 2024-02-04 DIAGNOSIS — Z6833 Body mass index (BMI) 33.0-33.9, adult: Secondary | ICD-10-CM | POA: Diagnosis not present

## 2024-02-04 DIAGNOSIS — R059 Cough, unspecified: Secondary | ICD-10-CM | POA: Diagnosis not present

## 2024-02-08 DIAGNOSIS — Z6832 Body mass index (BMI) 32.0-32.9, adult: Secondary | ICD-10-CM | POA: Diagnosis not present

## 2024-02-08 DIAGNOSIS — J180 Bronchopneumonia, unspecified organism: Secondary | ICD-10-CM | POA: Diagnosis not present

## 2024-02-08 DIAGNOSIS — R059 Cough, unspecified: Secondary | ICD-10-CM | POA: Diagnosis not present

## 2024-02-08 DIAGNOSIS — R0602 Shortness of breath: Secondary | ICD-10-CM | POA: Diagnosis not present

## 2024-02-11 DIAGNOSIS — G4733 Obstructive sleep apnea (adult) (pediatric): Secondary | ICD-10-CM | POA: Diagnosis not present

## 2024-02-26 DIAGNOSIS — M25512 Pain in left shoulder: Secondary | ICD-10-CM | POA: Diagnosis not present

## 2024-02-26 DIAGNOSIS — Z96651 Presence of right artificial knee joint: Secondary | ICD-10-CM | POA: Diagnosis not present

## 2024-03-01 DIAGNOSIS — R5383 Other fatigue: Secondary | ICD-10-CM | POA: Diagnosis not present

## 2024-03-01 DIAGNOSIS — R062 Wheezing: Secondary | ICD-10-CM | POA: Diagnosis not present

## 2024-03-01 DIAGNOSIS — Z6833 Body mass index (BMI) 33.0-33.9, adult: Secondary | ICD-10-CM | POA: Diagnosis not present

## 2024-03-01 DIAGNOSIS — R197 Diarrhea, unspecified: Secondary | ICD-10-CM | POA: Diagnosis not present

## 2024-03-10 DIAGNOSIS — M25511 Pain in right shoulder: Secondary | ICD-10-CM | POA: Diagnosis not present

## 2024-03-10 DIAGNOSIS — M25512 Pain in left shoulder: Secondary | ICD-10-CM | POA: Diagnosis not present

## 2024-03-10 DIAGNOSIS — G4733 Obstructive sleep apnea (adult) (pediatric): Secondary | ICD-10-CM | POA: Diagnosis not present

## 2024-03-24 DIAGNOSIS — M25512 Pain in left shoulder: Secondary | ICD-10-CM | POA: Diagnosis not present

## 2024-03-24 DIAGNOSIS — M25511 Pain in right shoulder: Secondary | ICD-10-CM | POA: Diagnosis not present

## 2024-03-29 DIAGNOSIS — M7542 Impingement syndrome of left shoulder: Secondary | ICD-10-CM | POA: Diagnosis not present

## 2024-03-29 DIAGNOSIS — M7541 Impingement syndrome of right shoulder: Secondary | ICD-10-CM | POA: Diagnosis not present

## 2024-04-04 DIAGNOSIS — Z79899 Other long term (current) drug therapy: Secondary | ICD-10-CM | POA: Diagnosis not present

## 2024-04-04 DIAGNOSIS — E785 Hyperlipidemia, unspecified: Secondary | ICD-10-CM | POA: Diagnosis not present

## 2024-04-04 DIAGNOSIS — R7301 Impaired fasting glucose: Secondary | ICD-10-CM | POA: Diagnosis not present

## 2024-04-06 DIAGNOSIS — M7541 Impingement syndrome of right shoulder: Secondary | ICD-10-CM | POA: Diagnosis not present

## 2024-04-10 DIAGNOSIS — G4733 Obstructive sleep apnea (adult) (pediatric): Secondary | ICD-10-CM | POA: Diagnosis not present

## 2024-04-12 DIAGNOSIS — M12811 Other specific arthropathies, not elsewhere classified, right shoulder: Secondary | ICD-10-CM | POA: Diagnosis not present

## 2024-04-12 DIAGNOSIS — M7542 Impingement syndrome of left shoulder: Secondary | ICD-10-CM | POA: Diagnosis not present

## 2024-04-12 DIAGNOSIS — M75101 Unspecified rotator cuff tear or rupture of right shoulder, not specified as traumatic: Secondary | ICD-10-CM | POA: Diagnosis not present

## 2024-04-13 DIAGNOSIS — F419 Anxiety disorder, unspecified: Secondary | ICD-10-CM | POA: Diagnosis not present

## 2024-04-13 DIAGNOSIS — K219 Gastro-esophageal reflux disease without esophagitis: Secondary | ICD-10-CM | POA: Diagnosis not present

## 2024-04-13 DIAGNOSIS — E785 Hyperlipidemia, unspecified: Secondary | ICD-10-CM | POA: Diagnosis not present

## 2024-04-13 DIAGNOSIS — R7303 Prediabetes: Secondary | ICD-10-CM | POA: Diagnosis not present

## 2024-04-20 DIAGNOSIS — M7542 Impingement syndrome of left shoulder: Secondary | ICD-10-CM | POA: Diagnosis not present

## 2024-05-09 DIAGNOSIS — R0600 Dyspnea, unspecified: Secondary | ICD-10-CM | POA: Diagnosis not present

## 2024-05-09 DIAGNOSIS — R600 Localized edema: Secondary | ICD-10-CM | POA: Diagnosis not present

## 2024-05-09 DIAGNOSIS — Z01812 Encounter for preprocedural laboratory examination: Secondary | ICD-10-CM | POA: Diagnosis not present

## 2024-05-09 DIAGNOSIS — R7303 Prediabetes: Secondary | ICD-10-CM | POA: Diagnosis not present

## 2024-05-09 DIAGNOSIS — G4733 Obstructive sleep apnea (adult) (pediatric): Secondary | ICD-10-CM | POA: Diagnosis not present

## 2024-05-09 DIAGNOSIS — M12811 Other specific arthropathies, not elsewhere classified, right shoulder: Secondary | ICD-10-CM | POA: Diagnosis not present

## 2024-05-09 DIAGNOSIS — Z01818 Encounter for other preprocedural examination: Secondary | ICD-10-CM | POA: Diagnosis not present

## 2024-05-09 DIAGNOSIS — E785 Hyperlipidemia, unspecified: Secondary | ICD-10-CM | POA: Diagnosis not present

## 2024-05-09 DIAGNOSIS — M25511 Pain in right shoulder: Secondary | ICD-10-CM | POA: Diagnosis not present

## 2024-05-10 DIAGNOSIS — G4733 Obstructive sleep apnea (adult) (pediatric): Secondary | ICD-10-CM | POA: Diagnosis not present

## 2024-05-17 DIAGNOSIS — M75101 Unspecified rotator cuff tear or rupture of right shoulder, not specified as traumatic: Secondary | ICD-10-CM | POA: Diagnosis not present

## 2024-05-17 DIAGNOSIS — Z01818 Encounter for other preprocedural examination: Secondary | ICD-10-CM | POA: Diagnosis not present

## 2024-05-17 DIAGNOSIS — M7542 Impingement syndrome of left shoulder: Secondary | ICD-10-CM | POA: Diagnosis not present

## 2024-05-17 DIAGNOSIS — M7541 Impingement syndrome of right shoulder: Secondary | ICD-10-CM | POA: Diagnosis not present

## 2024-05-17 DIAGNOSIS — Z01812 Encounter for preprocedural laboratory examination: Secondary | ICD-10-CM | POA: Diagnosis not present

## 2024-05-23 DIAGNOSIS — K219 Gastro-esophageal reflux disease without esophagitis: Secondary | ICD-10-CM | POA: Diagnosis not present

## 2024-05-23 DIAGNOSIS — G8918 Other acute postprocedural pain: Secondary | ICD-10-CM | POA: Diagnosis not present

## 2024-05-23 DIAGNOSIS — Z85828 Personal history of other malignant neoplasm of skin: Secondary | ICD-10-CM | POA: Diagnosis not present

## 2024-05-23 DIAGNOSIS — Z79899 Other long term (current) drug therapy: Secondary | ICD-10-CM | POA: Diagnosis not present

## 2024-05-23 DIAGNOSIS — G4733 Obstructive sleep apnea (adult) (pediatric): Secondary | ICD-10-CM | POA: Diagnosis not present

## 2024-05-23 DIAGNOSIS — Z96651 Presence of right artificial knee joint: Secondary | ICD-10-CM | POA: Diagnosis not present

## 2024-05-23 DIAGNOSIS — M12811 Other specific arthropathies, not elsewhere classified, right shoulder: Secondary | ICD-10-CM | POA: Diagnosis not present

## 2024-05-23 DIAGNOSIS — F419 Anxiety disorder, unspecified: Secondary | ICD-10-CM | POA: Diagnosis not present

## 2024-05-23 DIAGNOSIS — M75121 Complete rotator cuff tear or rupture of right shoulder, not specified as traumatic: Secondary | ICD-10-CM | POA: Diagnosis not present

## 2024-05-23 DIAGNOSIS — Z471 Aftercare following joint replacement surgery: Secondary | ICD-10-CM | POA: Diagnosis not present

## 2024-05-23 DIAGNOSIS — M75101 Unspecified rotator cuff tear or rupture of right shoulder, not specified as traumatic: Secondary | ICD-10-CM | POA: Diagnosis not present

## 2024-05-23 DIAGNOSIS — Z90710 Acquired absence of both cervix and uterus: Secondary | ICD-10-CM | POA: Diagnosis not present

## 2024-05-23 DIAGNOSIS — Z6832 Body mass index (BMI) 32.0-32.9, adult: Secondary | ICD-10-CM | POA: Diagnosis not present

## 2024-05-23 DIAGNOSIS — Z888 Allergy status to other drugs, medicaments and biological substances status: Secondary | ICD-10-CM | POA: Diagnosis not present

## 2024-05-23 DIAGNOSIS — Z96611 Presence of right artificial shoulder joint: Secondary | ICD-10-CM | POA: Diagnosis not present

## 2024-05-23 DIAGNOSIS — E669 Obesity, unspecified: Secondary | ICD-10-CM | POA: Diagnosis not present

## 2024-05-23 DIAGNOSIS — Z87891 Personal history of nicotine dependence: Secondary | ICD-10-CM | POA: Diagnosis not present

## 2024-05-24 DIAGNOSIS — Z85828 Personal history of other malignant neoplasm of skin: Secondary | ICD-10-CM | POA: Diagnosis not present

## 2024-05-24 DIAGNOSIS — K219 Gastro-esophageal reflux disease without esophagitis: Secondary | ICD-10-CM | POA: Diagnosis not present

## 2024-05-24 DIAGNOSIS — G4733 Obstructive sleep apnea (adult) (pediatric): Secondary | ICD-10-CM | POA: Diagnosis not present

## 2024-05-24 DIAGNOSIS — Z888 Allergy status to other drugs, medicaments and biological substances status: Secondary | ICD-10-CM | POA: Diagnosis not present

## 2024-05-24 DIAGNOSIS — M75101 Unspecified rotator cuff tear or rupture of right shoulder, not specified as traumatic: Secondary | ICD-10-CM | POA: Diagnosis not present

## 2024-05-24 DIAGNOSIS — Z90710 Acquired absence of both cervix and uterus: Secondary | ICD-10-CM | POA: Diagnosis not present

## 2024-05-24 DIAGNOSIS — Z6832 Body mass index (BMI) 32.0-32.9, adult: Secondary | ICD-10-CM | POA: Diagnosis not present

## 2024-05-24 DIAGNOSIS — Z79899 Other long term (current) drug therapy: Secondary | ICD-10-CM | POA: Diagnosis not present

## 2024-05-24 DIAGNOSIS — F419 Anxiety disorder, unspecified: Secondary | ICD-10-CM | POA: Diagnosis not present

## 2024-05-24 DIAGNOSIS — E669 Obesity, unspecified: Secondary | ICD-10-CM | POA: Diagnosis not present

## 2024-05-24 DIAGNOSIS — Z96651 Presence of right artificial knee joint: Secondary | ICD-10-CM | POA: Diagnosis not present

## 2024-05-24 DIAGNOSIS — Z87891 Personal history of nicotine dependence: Secondary | ICD-10-CM | POA: Diagnosis not present

## 2024-05-30 DIAGNOSIS — M25511 Pain in right shoulder: Secondary | ICD-10-CM | POA: Diagnosis not present

## 2024-06-03 DIAGNOSIS — M25511 Pain in right shoulder: Secondary | ICD-10-CM | POA: Diagnosis not present

## 2024-06-08 DIAGNOSIS — M25511 Pain in right shoulder: Secondary | ICD-10-CM | POA: Diagnosis not present

## 2024-06-09 DIAGNOSIS — M25511 Pain in right shoulder: Secondary | ICD-10-CM | POA: Diagnosis not present

## 2024-06-10 DIAGNOSIS — M25511 Pain in right shoulder: Secondary | ICD-10-CM | POA: Diagnosis not present

## 2024-06-10 DIAGNOSIS — G4733 Obstructive sleep apnea (adult) (pediatric): Secondary | ICD-10-CM | POA: Diagnosis not present

## 2024-06-16 DIAGNOSIS — M25511 Pain in right shoulder: Secondary | ICD-10-CM | POA: Diagnosis not present

## 2024-06-20 DIAGNOSIS — M25511 Pain in right shoulder: Secondary | ICD-10-CM | POA: Diagnosis not present

## 2024-06-22 DIAGNOSIS — M25511 Pain in right shoulder: Secondary | ICD-10-CM | POA: Diagnosis not present

## 2024-06-28 DIAGNOSIS — M25511 Pain in right shoulder: Secondary | ICD-10-CM | POA: Diagnosis not present

## 2024-07-01 DIAGNOSIS — M25511 Pain in right shoulder: Secondary | ICD-10-CM | POA: Diagnosis not present

## 2024-07-04 DIAGNOSIS — M25511 Pain in right shoulder: Secondary | ICD-10-CM | POA: Diagnosis not present

## 2024-07-07 DIAGNOSIS — M25511 Pain in right shoulder: Secondary | ICD-10-CM | POA: Diagnosis not present

## 2024-07-11 DIAGNOSIS — M25511 Pain in right shoulder: Secondary | ICD-10-CM | POA: Diagnosis not present

## 2024-07-13 DIAGNOSIS — F639 Impulse disorder, unspecified: Secondary | ICD-10-CM | POA: Diagnosis not present

## 2024-07-13 DIAGNOSIS — K219 Gastro-esophageal reflux disease without esophagitis: Secondary | ICD-10-CM | POA: Diagnosis not present

## 2024-07-13 DIAGNOSIS — E785 Hyperlipidemia, unspecified: Secondary | ICD-10-CM | POA: Diagnosis not present

## 2024-07-14 DIAGNOSIS — M25511 Pain in right shoulder: Secondary | ICD-10-CM | POA: Diagnosis not present

## 2024-07-18 DIAGNOSIS — M25511 Pain in right shoulder: Secondary | ICD-10-CM | POA: Diagnosis not present

## 2024-07-21 DIAGNOSIS — M25511 Pain in right shoulder: Secondary | ICD-10-CM | POA: Diagnosis not present

## 2024-07-22 DIAGNOSIS — G4733 Obstructive sleep apnea (adult) (pediatric): Secondary | ICD-10-CM | POA: Diagnosis not present

## 2024-07-25 DIAGNOSIS — M25511 Pain in right shoulder: Secondary | ICD-10-CM | POA: Diagnosis not present

## 2024-07-27 DIAGNOSIS — Z79899 Other long term (current) drug therapy: Secondary | ICD-10-CM | POA: Diagnosis not present

## 2024-07-27 DIAGNOSIS — R7303 Prediabetes: Secondary | ICD-10-CM | POA: Diagnosis not present

## 2024-07-27 DIAGNOSIS — E785 Hyperlipidemia, unspecified: Secondary | ICD-10-CM | POA: Diagnosis not present

## 2024-07-28 DIAGNOSIS — M25511 Pain in right shoulder: Secondary | ICD-10-CM | POA: Diagnosis not present

## 2024-08-01 DIAGNOSIS — M25511 Pain in right shoulder: Secondary | ICD-10-CM | POA: Diagnosis not present

## 2024-08-04 DIAGNOSIS — M25511 Pain in right shoulder: Secondary | ICD-10-CM | POA: Diagnosis not present

## 2024-08-08 DIAGNOSIS — M25511 Pain in right shoulder: Secondary | ICD-10-CM | POA: Diagnosis not present

## 2024-08-11 DIAGNOSIS — M25511 Pain in right shoulder: Secondary | ICD-10-CM | POA: Diagnosis not present

## 2024-08-15 DIAGNOSIS — M25511 Pain in right shoulder: Secondary | ICD-10-CM | POA: Diagnosis not present

## 2024-08-16 DIAGNOSIS — M19012 Primary osteoarthritis, left shoulder: Secondary | ICD-10-CM | POA: Diagnosis not present

## 2024-08-16 DIAGNOSIS — M7522 Bicipital tendinitis, left shoulder: Secondary | ICD-10-CM | POA: Diagnosis not present

## 2024-08-16 DIAGNOSIS — Z5189 Encounter for other specified aftercare: Secondary | ICD-10-CM | POA: Diagnosis not present

## 2024-08-16 DIAGNOSIS — M75122 Complete rotator cuff tear or rupture of left shoulder, not specified as traumatic: Secondary | ICD-10-CM | POA: Diagnosis not present

## 2024-08-23 DIAGNOSIS — M25511 Pain in right shoulder: Secondary | ICD-10-CM | POA: Diagnosis not present

## 2024-09-01 DIAGNOSIS — M25511 Pain in right shoulder: Secondary | ICD-10-CM | POA: Diagnosis not present

## 2024-09-05 DIAGNOSIS — M25511 Pain in right shoulder: Secondary | ICD-10-CM | POA: Diagnosis not present

## 2024-09-12 DIAGNOSIS — M25511 Pain in right shoulder: Secondary | ICD-10-CM | POA: Diagnosis not present

## 2024-09-14 DIAGNOSIS — M25511 Pain in right shoulder: Secondary | ICD-10-CM | POA: Diagnosis not present

## 2024-09-19 DIAGNOSIS — M25511 Pain in right shoulder: Secondary | ICD-10-CM | POA: Diagnosis not present

## 2024-09-21 DIAGNOSIS — M25512 Pain in left shoulder: Secondary | ICD-10-CM | POA: Diagnosis not present

## 2024-09-22 DIAGNOSIS — M25511 Pain in right shoulder: Secondary | ICD-10-CM | POA: Diagnosis not present

## 2024-09-26 DIAGNOSIS — M25511 Pain in right shoulder: Secondary | ICD-10-CM | POA: Diagnosis not present

## 2024-09-28 DIAGNOSIS — M25511 Pain in right shoulder: Secondary | ICD-10-CM | POA: Diagnosis not present

## 2024-10-04 DIAGNOSIS — M25511 Pain in right shoulder: Secondary | ICD-10-CM | POA: Diagnosis not present

## 2024-10-18 DIAGNOSIS — M75122 Complete rotator cuff tear or rupture of left shoulder, not specified as traumatic: Secondary | ICD-10-CM | POA: Diagnosis not present

## 2024-10-19 DIAGNOSIS — M75122 Complete rotator cuff tear or rupture of left shoulder, not specified as traumatic: Secondary | ICD-10-CM | POA: Diagnosis not present

## 2024-10-19 DIAGNOSIS — M25512 Pain in left shoulder: Secondary | ICD-10-CM | POA: Diagnosis not present

## 2024-10-19 DIAGNOSIS — M75112 Incomplete rotator cuff tear or rupture of left shoulder, not specified as traumatic: Secondary | ICD-10-CM | POA: Diagnosis not present

## 2024-10-19 DIAGNOSIS — M7542 Impingement syndrome of left shoulder: Secondary | ICD-10-CM | POA: Diagnosis not present

## 2024-10-19 DIAGNOSIS — S46212A Strain of muscle, fascia and tendon of other parts of biceps, left arm, initial encounter: Secondary | ICD-10-CM | POA: Diagnosis not present

## 2024-10-19 DIAGNOSIS — M25812 Other specified joint disorders, left shoulder: Secondary | ICD-10-CM | POA: Diagnosis not present

## 2024-10-19 DIAGNOSIS — M65912 Unspecified synovitis and tenosynovitis, left shoulder: Secondary | ICD-10-CM | POA: Diagnosis not present

## 2024-10-19 DIAGNOSIS — M19012 Primary osteoarthritis, left shoulder: Secondary | ICD-10-CM | POA: Diagnosis not present

## 2024-10-19 DIAGNOSIS — S43432A Superior glenoid labrum lesion of left shoulder, initial encounter: Secondary | ICD-10-CM | POA: Diagnosis not present

## 2024-10-19 DIAGNOSIS — G8918 Other acute postprocedural pain: Secondary | ICD-10-CM | POA: Diagnosis not present

## 2024-10-25 DIAGNOSIS — M25512 Pain in left shoulder: Secondary | ICD-10-CM | POA: Diagnosis not present

## 2024-10-28 DIAGNOSIS — M25512 Pain in left shoulder: Secondary | ICD-10-CM | POA: Diagnosis not present

## 2024-11-02 DIAGNOSIS — M25512 Pain in left shoulder: Secondary | ICD-10-CM | POA: Diagnosis not present

## 2024-11-09 DIAGNOSIS — M25512 Pain in left shoulder: Secondary | ICD-10-CM | POA: Diagnosis not present

## 2024-11-16 DIAGNOSIS — M25512 Pain in left shoulder: Secondary | ICD-10-CM | POA: Diagnosis not present
